# Patient Record
Sex: Male | Born: 1935
Health system: Southern US, Community
[De-identification: ages and names within clinical notes are randomized; demographics above are authoritative.]

## PROBLEM LIST (undated history)

## (undated) DIAGNOSIS — Z85810 Personal history of malignant neoplasm of tongue: Secondary | ICD-10-CM

## (undated) DIAGNOSIS — H269 Unspecified cataract: Secondary | ICD-10-CM

## (undated) DIAGNOSIS — R2681 Unsteadiness on feet: Secondary | ICD-10-CM

## (undated) DIAGNOSIS — S72001D Fracture of unspecified part of neck of right femur, subsequent encounter for closed fracture with routine healing: Secondary | ICD-10-CM

## (undated) DIAGNOSIS — N400 Enlarged prostate without lower urinary tract symptoms: Secondary | ICD-10-CM

## (undated) DIAGNOSIS — E46 Unspecified protein-calorie malnutrition: Secondary | ICD-10-CM

## (undated) DIAGNOSIS — N183 Chronic kidney disease, stage 3 unspecified: Secondary | ICD-10-CM

## (undated) DIAGNOSIS — G3183 Dementia with Lewy bodies: Secondary | ICD-10-CM

## (undated) DIAGNOSIS — G20 Parkinson's disease: Secondary | ICD-10-CM

## (undated) DIAGNOSIS — D62 Acute posthemorrhagic anemia: Secondary | ICD-10-CM

## (undated) DIAGNOSIS — G20A1 Parkinson's disease without dyskinesia, without mention of fluctuations: Secondary | ICD-10-CM

## (undated) DIAGNOSIS — F0280 Dementia in other diseases classified elsewhere without behavioral disturbance: Secondary | ICD-10-CM

## (undated) HISTORY — PX: EYE SURGERY: SHX253

## (undated) HISTORY — PX: TRANSURETHRAL RESECTION OF PROSTATE: SHX73

## (undated) HISTORY — DX: Dementia in other diseases classified elsewhere, unspecified severity, without behavioral disturbance, psychotic disturbance, mood disturbance, and anxiety: F02.80

## (undated) HISTORY — DX: Unsteadiness on feet: R26.81

## (undated) HISTORY — DX: Benign prostatic hyperplasia without lower urinary tract symptoms: N40.0

## (undated) HISTORY — DX: Unspecified protein-calorie malnutrition: E46

## (undated) HISTORY — DX: Dementia with Lewy bodies: G31.83

## (undated) HISTORY — PX: TONSILLECTOMY AND ADENOIDECTOMY: SHX28

## (undated) HISTORY — DX: Unspecified cataract: H26.9

## (undated) HISTORY — DX: Personal history of malignant neoplasm of tongue: Z85.810

## (undated) HISTORY — DX: Fracture of unspecified part of neck of right femur, subsequent encounter for closed fracture with routine healing: S72.001D

## (undated) HISTORY — PX: APPENDECTOMY: SHX54

## (undated) HISTORY — DX: Acute posthemorrhagic anemia: D62

---
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
## 2001-04-19 ENCOUNTER — Inpatient Hospital Stay (HOSPITAL_COMMUNITY): Admission: RE | Admit: 2001-04-19 | Discharge: 2001-04-21 | Payer: Self-pay | Admitting: Urology

## 2001-04-19 ENCOUNTER — Encounter (INDEPENDENT_AMBULATORY_CARE_PROVIDER_SITE_OTHER): Payer: Self-pay

## 2002-02-21 ENCOUNTER — Encounter (INDEPENDENT_AMBULATORY_CARE_PROVIDER_SITE_OTHER): Payer: Self-pay | Admitting: *Deleted

## 2002-10-26 ENCOUNTER — Encounter (INDEPENDENT_AMBULATORY_CARE_PROVIDER_SITE_OTHER): Payer: Self-pay | Admitting: *Deleted

## 2002-10-26 ENCOUNTER — Encounter (INDEPENDENT_AMBULATORY_CARE_PROVIDER_SITE_OTHER): Payer: Self-pay | Admitting: Gastroenterology

## 2003-05-03 ENCOUNTER — Encounter (INDEPENDENT_AMBULATORY_CARE_PROVIDER_SITE_OTHER): Payer: Self-pay | Admitting: *Deleted

## 2004-04-15 ENCOUNTER — Encounter (INDEPENDENT_AMBULATORY_CARE_PROVIDER_SITE_OTHER): Payer: Self-pay | Admitting: *Deleted

## 2005-03-30 ENCOUNTER — Ambulatory Visit: Payer: Self-pay | Admitting: Internal Medicine

## 2005-04-14 ENCOUNTER — Ambulatory Visit: Payer: Self-pay | Admitting: Internal Medicine

## 2005-04-23 ENCOUNTER — Ambulatory Visit: Payer: Self-pay | Admitting: Internal Medicine

## 2005-04-23 ENCOUNTER — Ambulatory Visit (HOSPITAL_COMMUNITY): Admission: RE | Admit: 2005-04-23 | Discharge: 2005-04-23 | Payer: Self-pay | Admitting: Internal Medicine

## 2005-04-23 ENCOUNTER — Encounter (INDEPENDENT_AMBULATORY_CARE_PROVIDER_SITE_OTHER): Payer: Self-pay | Admitting: Specialist

## 2006-03-02 ENCOUNTER — Encounter: Admission: RE | Admit: 2006-03-02 | Discharge: 2006-03-02 | Payer: Self-pay | Admitting: Internal Medicine

## 2008-07-05 ENCOUNTER — Encounter: Payer: Self-pay | Admitting: Internal Medicine

## 2008-10-25 ENCOUNTER — Ambulatory Visit: Payer: Self-pay | Admitting: Internal Medicine

## 2008-10-25 DIAGNOSIS — Z860100 Personal history of colon polyps, unspecified: Secondary | ICD-10-CM | POA: Insufficient documentation

## 2008-10-25 DIAGNOSIS — R195 Other fecal abnormalities: Secondary | ICD-10-CM | POA: Insufficient documentation

## 2008-10-26 ENCOUNTER — Encounter: Payer: Self-pay | Admitting: Internal Medicine

## 2008-10-31 ENCOUNTER — Ambulatory Visit: Payer: Self-pay | Admitting: Internal Medicine

## 2008-10-31 ENCOUNTER — Ambulatory Visit (HOSPITAL_COMMUNITY): Admission: RE | Admit: 2008-10-31 | Discharge: 2008-10-31 | Payer: Self-pay | Admitting: Internal Medicine

## 2008-10-31 ENCOUNTER — Encounter: Payer: Self-pay | Admitting: Internal Medicine

## 2008-11-02 ENCOUNTER — Encounter: Payer: Self-pay | Admitting: Internal Medicine

## 2009-10-09 ENCOUNTER — Telehealth (INDEPENDENT_AMBULATORY_CARE_PROVIDER_SITE_OTHER): Payer: Self-pay | Admitting: *Deleted

## 2009-11-18 ENCOUNTER — Encounter (INDEPENDENT_AMBULATORY_CARE_PROVIDER_SITE_OTHER): Payer: Self-pay | Admitting: *Deleted

## 2009-11-28 ENCOUNTER — Telehealth: Payer: Self-pay | Admitting: Internal Medicine

## 2009-12-02 ENCOUNTER — Encounter: Payer: Self-pay | Admitting: Internal Medicine

## 2010-01-02 ENCOUNTER — Ambulatory Visit: Payer: Self-pay | Admitting: Internal Medicine

## 2010-01-02 DIAGNOSIS — D126 Benign neoplasm of colon, unspecified: Secondary | ICD-10-CM | POA: Insufficient documentation

## 2010-01-16 ENCOUNTER — Ambulatory Visit: Payer: Self-pay | Admitting: Internal Medicine

## 2010-01-20 ENCOUNTER — Encounter: Payer: Self-pay | Admitting: Internal Medicine

## 2011-01-29 NOTE — Procedures (Signed)
Summary: Colonoscopy  Patient: Nicholas Andrews Note: All result statuses are Final unless otherwise noted.  Tests: (1) Colonoscopy (COL)   COL Colonoscopy           DONE     [facility]     [address]     [place], [place]  [address]           COLONOSCOPY PROCEDURE REPORT           PATIENT:  Nicholas Andrews, Nicholas Andrews  MR#:  [id number]     BIRTHDATE:  03/11/1918, 89 yrs. old  GENDER:  male           ENDOSCOPIST:  Smitty Ackerley. Eda Keys, MD     Referred by:  Jarome Matin, M.D.           PROCEDURE DATE:  01/16/2010     PROCEDURE:  Colonoscopy with snare polypectomy     ASA CLASS:  Class II     INDICATIONS:  history of multiple pre-cancerous (adenomatous)     colon polyps (sessile Villous cecal lesion piecemealed one year     ago)           MEDICATIONS:   Fentanyl 25 mcg IV, Versed 4 mg IV           DESCRIPTION OF PROCEDURE:   After the risks benefits and     alternatives of the procedure were thoroughly explained, informed     consent was obtained.  Digital rectal exam was performed and     revealed no abnormalities.   The LB CF-[id number] endoscope     was introduced through the anus and advanced to the cecum, which     was identified by both the appendix and ileocecal valve, without     limitations.Time to cecum = 4:57min. The quality of the prep was     good, using MoviPrep.  The instrument was then slowly withdrawn     (time = 16:06 min) as the colon was fully examined.     <<PROCEDUREIMAGES>>           FINDINGS:  A 5mm sessile polyp was found in the cecum. The prior     polypectomy site was clean. Polyp was snared without cautery.     Retrieval was successful.  A 6mm pedunculated polyp was found in     the descending colon. Polyp was snared without cautery. Retrieval     was successful. Some persistant post polypectomy oozing was     controlled w/ cautery.  This was otherwise a normal examination of     the colon. The previously tatoo'd area in the cecum was clean.  Retroflexed views in the rectum revealed no abnormalities.    The     scope was then withdrawn from the patient and the procedure     completed.           COMPLICATIONS:  None           ENDOSCOPIC IMPRESSION:     1) Sessile polyp in the cecum - removed     2) Pedunculated polyp in the descending colon -removed     3) Otherwise normal examination     RECOMMENDATIONS:     1) Return to the care of your primary provider. GI follow up as     needed           ______________________________     Wilhemina Bonito. Eda Keys, MD  CC:  Jarome Matin, MD;The Patient           n.     eSIGNED:   Wilhemina Bonito. Eda Keys at 01/16/2010 04:22 PM           Vida Rigger, [id number]  Note: An exclamation mark (!) indicates a result that was not dispersed into the flowsheet. Document Creation Date: 01/16/2010 4:22 PM _______________________________________________________________________  (1) Order result status: Final Collection or observation date-time: 01/16/2010 16:11 Requested date-time:  Receipt date-time:  Reported date-time:  Referring Physician:   Ordering Physician: Fransico Setters [id number]) Specimen Source:  Source: Launa Grill Order Number: [id number] Lab site:

## 2011-01-29 NOTE — Letter (Signed)
Summary: Patient Notice- Polyp Results  [facility]  [address] [place], [place] [address]   Phone: [phone]  Fax: [phone]        January 20, 2010 MRN: [id number]    Nicholas Andrews [address] [place], [place]  [address]    Dear Mr. COMPEAN,  I am pleased to inform you that the colon polyps removed during your recent colonoscopy were found to be benign (no cancer detected) upon pathologic examination.    Additional information/recommendations:  __ No further action with gastroenterology is needed at this time. Please      follow-up with your primary care physician for your other healthcare      needs.    Please call us if you are having persistent problems or have questions about your condition that have not been fully answered at this time.  Sincerely,  Hilarie Fredrickson MD  This letter has been electronically signed by your physician.  Appended Document: Patient Notice- Polyp Results letter mailed 1.25.11

## 2011-01-29 NOTE — Procedures (Signed)
Summary: colonoscopy   Colonoscopy  Procedure date:  02/21/2002  Findings:      Pathology:  Adenomatous polyp.        Location:  [facility].    Procedures Next Due Date:    Colonoscopy: 02/2003 Patient Name: Nicholas Andrews, Nicholas Andrews MRN:  Procedure Procedures: Colonoscopy CPT: 45409.    with biopsy. CPT: Q5068410.    with Hot Biopsy(s)CPT: Z451292.    with polypectomy. CPT: A3573898.  Personnel: Endoscopist: Ulyess Mort, MD.  Referred By: Barry Dienes. Eloise Harman, MD.  Exam Location: Exam performed in Outpatient Clinic. Outpatient  Patient Consent: Procedure, Alternatives, Risks and Benefits discussed, consent obtained, from patient. Consent was obtained by the RN.  Indications  Average Risk Screening Routine.  History  Pre-Exam Physical: Performed Feb 21, 2002. Cardio-pulmonary exam, Rectal exam, HEENT exam , Abdominal exam, Extremity exam, Mental status exam WNL.  Exam Exam: Extent of exam reached: Cecum, extent intended: Cecum.  The cecum was identified by appendiceal orifice and IC valve. Colon retroflexion performed. Images were not taken. ASA Classification: II. Tolerance: good.  Monitoring: Pulse and BP monitoring, Oximetry used. Supplemental O2 given.  Colon Prep Prep results: fair.  Sedation Meds: Patient assessed and found to be appropriate for moderate (conscious) sedation. Fentanyl 50 mcg. given IV. Versed 5 mg. given IV.  Findings POLYP: Descending Colon, Maximum size: 6 mm. sessile polyp. Procedure:  snare with cautery, removed, retrieved, Polyp sent to pathology. ICD9: Colon Polyps: 211.3.  - POLYP: Sigmoid Colon, Maximum size: 12 mm. pedunculated polyp. Procedure:  snare with cautery, removed, retrieved, sent to pathology. ICD9: Colon Polyps: 211.3.  - POLYP: Sigmoid Colon, Maximum size: 9 mm. pedunculated polyp. Procedure:  snare with cautery, removed, retrieved, sent to pathology.  - POLYP: Sigmoid Colon, Maximum size: 25 mm. sessile polyp.  Procedure:  snare with cautery, The polyp was removed piece meal. removed, retrieved, sent to pathology. ICD9: Colon Polyps: 211.3. Comments: PROBABLE VILLOUS ADENOMA CAUTERIZED TO DESTRUCTION WITH HOT BX. Marland Kitchen   Assessment Abnormal examination, see findings above.  Diagnoses: 211.3: Colon Polyps.   Events  Unplanned Interventions: No intervention was required.  Unplanned Events: There were no complications. Plans Medication Plan: Await pathology. Continue current medications.  Patient Education: Patient given standard instructions for: Polyps. Yearly hemoccult testing recommended. Patient instructed to get routine colonoscopy every 1 years. RECHECK 6 MONTHS.  Disposition: After procedure patient sent to recovery. After recovery patient sent home.   This report was created from the original endoscopy report, which was reviewed and signed by the above listed endoscopist.

## 2011-01-29 NOTE — Assessment & Plan Note (Signed)
Summary: Discuss colonoscopy   History of Present Illness Visit Type: follow up  Primary GI MD: Yancey Flemings MD Primary Provider: Jarome Matin, MD Requesting Provider: n/a Chief Complaint: Consult colon. Pt denies any GI complaints  History of Present Illness:   Nicholas Andrews is an 77 year old gentleman with dyslipidemia, BPH, and adenomatous colon polyps. Patient underwent colonoscopy last year to reevaluate a large sessile lesion of the sigmoid colon. This area was clear of polypoid tissue. However, evaluation of the cecum revealed a large sessile tubulovillous adenoma. This was removed in a piecemeal fashion endoscopically. Follow up in one year recommended if the patient was medically fit and willing. He remains in remarkable health since his last visit. His chronic medical problems are all stable. We reviewed his previous colonoscopy in detail as well as the rationale behind surveillance colonoscopy at this time. He denies active GI symptoms, except for occasional constipation for which he takes MiraLax.   GI Review of Systems      Denies abdominal pain, acid reflux, belching, bloating, chest pain, dysphagia with liquids, dysphagia with solids, heartburn, loss of appetite, nausea, vomiting, vomiting blood, weight loss, and  weight gain.      Reports constipation.     Denies anal fissure, black tarry stools, change in bowel habit, diarrhea, diverticulosis, fecal incontinence, heme positive stool, hemorrhoids, irritable bowel syndrome, jaundice, light color stool, liver problems, rectal bleeding, and  rectal pain.    Current Medications (verified): 1)  Lovastatin 10 Mg Tabs (Lovastatin) .Marland Kitchen.. 1 Tablet By Mouth Once Daily 2)  Aspirin 81 Mg Tbec (Aspirin) .Marland Kitchen.. 1 Tablet By Mouth Once Daily 3)  Proscar 5 Mg Tabs (Finasteride) .Marland Kitchen.. 1 Tablet Once Daily 4)  Miralax  Powd (Polyethylene Glycol 3350) .... As Needed  Allergies (verified): No Known Drug Allergies  Past History:  Past Medical  History: Reviewed history from 10/23/2008 and no changes required. Adenomatous Colon Polyps Hyperlipidemia  Past Surgical History: Reviewed history from 10/25/2008 and no changes required. Unremarkable  Family History: Reviewed history from 10/25/2008 and no changes required. No FH of Colon Cancer:  Social History: Reviewed history from 10/23/2008 and no changes required. Occupation: Retired Patient has never smoked.  Alcohol Use - no Illicit Drug Use - no  Review of Systems       his non-GI review of systems was found to be entirely negative except for occasional urinary difficulties  Vital Signs:  Patient profile:   77 year old male Height:      74 inches Weight:      208 pounds BSA:     2.21 Pulse rate:   68 / minute Pulse rhythm:   regular BP sitting:   120 / 60  (left arm) Cuff size:   regular  Vitals Entered By: Ok Anis CMA (January 02, 2010 2:26 PM)  Physical Exam  General:  Well developed, well nourished, no acute distress. Head:  Normocephalic and atraumatic. Eyes:  PERRLA, no icterus. Nose:  No deformity, discharge,  or lesions. Mouth:  No deformity or lesions,  Neck:  Supple; no masses or thyromegaly. Chest Wall:  Symmetrical,  no deformities . Lungs:  Clear throughout to auscultation. Heart:  Regular rate and rhythm; no murmurs, rubs,  or bruits. Abdomen:  Soft, nontender and nondistended. No masses, hepatosplenomegaly or hernias noted. Normal bowel sounds. Rectal:  deferred Msk:  Symmetrical with no gross deformities. Normal posture. Pulses:  Normal pulses noted. Extremities:  No clubbing, cyanosis, edema or deformities noted. Neurologic:  Alert and  oriented x4;  grossly normal neurologically. Skin:  Intact without significant lesions or rashes. Psych:  Alert and cooperative. Normal mood and affect.   Impression & Recommendations:  Problem # 1:  TUBULOVILLOUS ADENOMA, COLON (ICD-211.3) large tubulovillous adenoma of the cecum removed  piecemeal. Followup at this time would be warranted due to the configuration of the polyp as well as the removal technique. The rationale is to exclude the presence of recurrent adenoma. I discussed with the patient in great detail the rationale behind reevaluation at this time. We discussed the nature of the procedure as well as the risks, benefits, and alternatives in detail. This with reticular consideration to his age.Marland Kitchen Despite his excellent health, he is at higher than baseline risk due to his age. Upon completion of the discussion, he was quite interested in proceeding. He understood everything is presented and his questions answered. Greater than 50% of the time during this encounter was spent counseling the patient.  Problem # 2:  PERSONAL HX COLONIC POLYPS (ICD-V12.72) prior history of large lesion of the sigmoid colon. Tattoo marking placed previously. No residual tumor last year. We will will reevaluate this area as well during the upcoming colonoscopy  Other Orders: Colonoscopy (Colon)  Patient Instructions: 1)  Colonoscopy [facility] 01/16/10 4:00 pm 2)  Movi prep instructions given to patient 3)  Movi prep Rx. sent to pharmacy 4)  Colonoscopy and Flexible Sigmoidoscopy brochure given.  5)  The medication list was reviewed and reconciled.  All changed / newly prescribed medications were explained.  A complete medication list was provided to the patient / caregiver. 6)  Copy: Dr. Jarome Matin Prescriptions: MOVIPREP 100 GM  SOLR (PEG-KCL-NACL-NASULF-NA ASC-C) As per prep instructions.  #1 x 0   Entered by:   Milford Cage NCMA   Authorized by:   Hilarie Fredrickson MD   Signed by:   Milford Cage NCMA on 01/02/2010   Method used:   Electronically to        [facility] [id number]* (retail)       [address] [place], [place]  [address]       Ph: [phone] or [phone]       Fax: [phone]   RxID:   [id number]

## 2011-01-29 NOTE — Letter (Signed)
Summary: [facility] Instructions  [facility]  [address] [place], [place] [address]   Phone: [phone]  Fax: [phone]       Nicholas Andrews    [birth year]    MRN: [id number]        Procedure Day /Date:THURSDAY, 01/16/10     Arrival Time:3:00 PM     Procedure Time:4:00 PM     Location of Procedure:                    X  [facility] (4th Floor)                        PREPARATION FOR COLONOSCOPY WITH MOVIPREP   Starting 5 days prior to your procedure 01/11/10 do not eat nuts, seeds, popcorn, corn, beans, peas,  salads, or any raw vegetables.  Do not take any fiber supplements (e.g. Metamucil, Citrucel, and Benefiber).  THE DAY BEFORE YOUR PROCEDURE         DATE: 01/15/10  DAY: WEDNESDAY  1.  Drink clear liquids the entire day-NO SOLID FOOD  2.  Do not drink anything colored red or purple.  Avoid juices with pulp.  No orange juice.  3.  Drink at least 64 oz. (8 glasses) of fluid/clear liquids during the day to prevent dehydration and help the prep work efficiently.  CLEAR LIQUIDS INCLUDE: Water Jello Ice Popsicles Tea (sugar ok, no milk/cream) Powdered fruit flavored drinks Coffee (sugar ok, no milk/cream) Gatorade Juice: apple, white grape, white cranberry  Lemonade Clear bullion, consomm, broth Carbonated beverages (any kind) Strained chicken noodle soup Hard Candy                             4.  In the morning, mix first dose of MoviPrep solution:    Empty 1 Pouch A and 1 Pouch B into the disposable container    Add lukewarm drinking water to the top line of the container. Mix to dissolve    Refrigerate (mixed solution should be used within 24 hrs)  5.  Begin drinking the prep at 5:00 p.m. The MoviPrep container is divided by 4 marks.   Every 15 minutes drink the solution down to the next mark (approximately 8 oz) until the full liter is complete.   6.  Follow completed prep with 16 oz of clear liquid of your choice (Nothing red or  purple).  Continue to drink clear liquids until bedtime.  7.  Before going to bed, mix second dose of MoviPrep solution:    Empty 1 Pouch A and 1 Pouch B into the disposable container    Add lukewarm drinking water to the top line of the container. Mix to dissolve    Refrigerate  THE DAY OF YOUR PROCEDURE      DATE: 01/16/10 ONG:EXBMWUXL  Beginning at 11:00 a.m. (5 hours before procedure):         1. Every 15 minutes, drink the solution down to the next mark (approx 8 oz) until the full liter is complete.  2. Follow completed prep with 16 oz. of clear liquid of your choice.    3. You may drink clear liquids until 2:00 PM(2 HOURS BEFORE PROCEDURE).   MEDICATION INSTRUCTIONS  Unless otherwise instructed, you should take regular prescription medications with a small sip of water   as early as possible the morning of your procedure.  OTHER INSTRUCTIONS  You will need a responsible adult at least 75 years of age to accompany you and drive you home.   This person must remain in the waiting room during your procedure.  Wear loose fitting clothing that is easily removed.  Leave jewelry and other valuables at home.  However, you may wish to bring a book to read or  an iPod/MP3 player to listen to music as you wait for your procedure to start.  Remove all body piercing jewelry and leave at home.  Total time from sign-in until discharge is approximately 2-3 hours.  You should go home directly after your procedure and rest.  You can resume normal activities the  day after your procedure.  The day of your procedure you should not:   Drive   Make legal decisions   Operate machinery   Drink alcohol   Return to work  You will receive specific instructions about eating, activities and medications before you leave.    The above instructions have been reviewed and explained to me by   _______________________    I fully understand and can verbalize these  instructions _____________________________ Date _________

## 2011-01-29 NOTE — Procedures (Signed)
Summary: Colonoscopy   Colonoscopy  Procedure date:  04/15/2004  Findings:      Location:  [facility].  Results: Polyp. sigmoid  Tubular Adenoma  Patient Name: Nicholas Andrews, Nicholas Andrews MRN:  Procedure Procedures: Colonoscopy CPT: [phone].  Personnel: Endoscopist: Ulyess Mort, MD.  Exam Location: Exam performed in Outpatient Clinic. Outpatient  Patient Consent: Procedure, Alternatives, Risks and Benefits discussed, consent obtained, from patient. Consent was obtained by the RN.  Indications  Surveillance of: Adenomatous Polyp(s).  History  Current Medications: Patient is not currently taking Coumadin.  Pre-Exam Physical: Performed May 03, 2003. Entire physical exam was normal. Cardio- pulmonary exam, Rectal exam, HEENT exam , Abdominal exam, Extremity exam, Mental status exam WNL.  Exam Exam: Extent of exam reached: Cecum, extent intended: Cecum.  The cecum was identified by appendiceal orifice and IC valve. Colon retroflexion performed. Images were not taken. ASA Classification: II. Tolerance: good.  Monitoring: Pulse and BP monitoring, Oximetry used. Supplemental O2 given.  Colon Prep Prep results: good.  Sedation Meds: Patient assessed and found to be appropriate for moderate (conscious) sedation. Fentanyl 50 mcg. given IV. Versed 5 mg. given IV.  Findings POLYP: Ascending Colon, sessile polyp. Procedure:  hot biopsy, removed, not retrieved,  - DIVERTICULOSIS: Descending Colon to Sigmoid Colon. ICD9: Diverticulosis: 562.10. Comments: MILD.  POLYP: Sigmoid Colon, Maximum size: 4 mm. sessile polyp. Procedure:  hot biopsy, removed, not retrieved,  - POLYP: Sigmoid Colon, Maximum size: 15 mm. sessile polyp. Procedure:  hot biopsy, removed, Polyp sent to pathology. ICD9: Colon Polyps: 211.3.   Assessment Abnormal examination, see findings above.  Diagnoses: 211.3: Colon Polyps.  562.10: Diverticulosis.   Events  Unplanned Interventions: No  intervention was required.  Unplanned Events: There were no complications. Plans Patient Education: Patient given standard instructions for: Polyps. Diverticulosis. Yearly hemoccult testing recommended. Patient instructed to get routine colonoscopy every 1 years. HAVE DR. PERRY SEE AND CONSIDER LASER.  Disposition: After procedure patient sent to recovery. After recovery patient sent home.   This report was created from the original endoscopy report, which was reviewed and signed by the above listed endoscopist.

## 2011-01-29 NOTE — Procedures (Signed)
Summary: Colonoscopy   Colonoscopy  Procedure date:  05/03/2003  Findings:      Location:  [facility].  Results: Polyp. Fragments of Adenoma (inflammation)   Colonoscopy  Procedure date:  05/03/2003  Findings:      Location:  [facility].  Results: Polyp. Fragments of Adenoma (inflammation)  Patient Name: Nicholas Andrews, Nicholas Andrews MRN:  Procedure Procedures: Colonoscopy CPT: [phone].    with Hot Biopsy(s)CPT: Z451292.    with polypectomy. CPT: A3573898.  Personnel: Endoscopist: Ulyess Mort, MD.  Exam Location: Exam performed in Outpatient Clinic. Outpatient  Patient Consent: Procedure, Alternatives, Risks and Benefits discussed, consent obtained, from patient. Consent was obtained by the RN.  Indications  Surveillance of: Adenomatous Polyp(s).  History  Pre-Exam Physical: Performed May 03, 2003. Cardio-pulmonary exam, Rectal exam, HEENT exam , Abdominal exam, Extremity exam, Mental status exam WNL.  Exam Exam: Extent of exam reached: Cecum, extent intended: Cecum.  The cecum was identified by appendiceal orifice and IC valve. Colon retroflexion performed. Images were not taken. ASA Classification: II. Tolerance: good.  Monitoring: Pulse and BP monitoring, Oximetry used. Supplemental O2 given.  Colon Prep Prep results: good.  Sedation Meds: Patient assessed and found to be appropriate for moderate (conscious) sedation. Fentanyl 50 mcg. given IV. Versed 3 mg. given IV.  Findings POLYP: Descending Colon, Maximum size: 2 mm. sessile polyp. Procedure:  hot biopsy, removed, not retrieved,  POLYP: Descending Colon, Maximum size: 2 mm. sessile polyp. Procedure:  hot biopsy, removed, not retrieved,  - POLYP: Sigmoid Colon, Maximum size: 22 mm. sessile polyp. Procedure:  snare with cautery, removed, retrieved, Polyp sent to pathology. ICD9: Colon Polyps: 211.3. Comments: 3cc saline injected into base.  POLYP: Sigmoid Colon, Maximum size: 2 mm. sessile  polyp. Procedure:  hot biopsy, removed, not retrieved,  POLYP: Sigmoid Colon, Maximum size: 2 mm. sessile polyp. Procedure:  hot biopsy, removed, not retrieved,   Assessment Abnormal examination, see findings above.  Diagnoses: 211.3: Colon Polyps.   Events  Unplanned Interventions: No intervention was required.  Unplanned Events: There were no complications. Plans Medication Plan: Await pathology. Continue current medications.  Patient Education: Patient given standard instructions for: Polyps. Yearly hemoccult testing recommended. Patient instructed to get routine colonoscopy every 1 years.  Disposition: After procedure patient sent to recovery. After recovery patient sent home.   This report was created from the original endoscopy report, which was reviewed and signed by the above listed endoscopist.

## 2011-05-15 NOTE — H&P (Signed)
[facility]  Patient:    Nicholas Andrews, Nicholas Andrews                     MRN: [id number] Adm. Date:  [id number] Attending:  Ellwood Handler                         History and Physical  DATE OF BIRTH:  [birth year].  REFERRING PHYSICIAN:  Tera Mater. Evlyn Kanner, M.D.  INDICATION:  [age]-year-old white male with obstructive voiding symptoms and presumed BPH.  These are notable points of the recent past medical history: #1 - October of 1992, PSA 1.0.  Symptoms treated with p.o. Proscar.  #2 - October of 1993, ultrasound showed a 12.0-cm right kidney, 11.9-cm left kidney with a 14-mm simple cyst.  CYSTO showed BPH and a bladder stone.  He was treated with a EHL and a VLAP.  #3 - February of 1998, PSA 2.4.  UTIs in 1997 and 2000.  #4 - April of 2002, returned to our office with increasing obstructive voiding symptoms.  IVP:  A 12.4-cm right kidney, 12.2-cm left kidney.  CYSTO showed PVR of 110 cc, with a densely trabeculated bladder and smooth regrowth from the lateral lobes, anatomy within the prostatic urethra somewhat distorted due to prior VLAP.  Discussed with patient the risks and benefits of the procedure and he agrees to proceed and understands the nature of condition and the procedure.  MEDICATIONS:  Aspirin -- discontinued April 09, 2001.  ALLERGIES:  Denies.  SOCIAL HISTORY:  Tobacco and ETOH:  Denies.  PAST MEDICAL HISTORY:  EHL of bladder stone, VLAP, 1993.  SOCIAL HISTORY:  Patients father died at 13; mother died at 22. [age]-year-old wife.  Four grown children.  PHYSICAL EXAMINATION AND REVIEW OF SYSTEMS:  GENERAL:  Tall, thin, healthy, alert 77 year old male looking younger than his stated age.  VITAL SIGNS:  Stable.  Afebrile.  HEAD AND NECK:  Negative adenopathy.  Negative bruit.  LUNGS:  Clear to P&A.  HEART:  Regular rate and rhythm without murmur or gallop.  ABDOMEN:  Positive bowel sounds.  Soft.  No mass or  organomegaly.  GU:  Bilaterally descended testes.  Normal prostate.  NEUROLOGIC:  Grossly intact.  PLAN:  Will proceed this a.m. with TURP. DD:  04/19/01 TD:  04/20/01 Job: [id number]

## 2011-05-15 NOTE — Discharge Summary (Signed)
[facility]  Patient:    Nicholas Andrews, Nicholas Andrews                     MRN: [id number] Adm. Date:  [id number] Disc. Date: [id number] Attending:  Ellwood Handler                           Discharge Summary  DATE OF BIRTH:  1918-01-01  PREOPERATIVE DIAGNOSIS:  Benign prostatic hypertrophy.  POSTOPERATIVE DIAGNOSIS:  Benign prostatic hypertrophy.  Indications, medications, allergies, tobacco, ETOH, past medical history, social history, physical examination, and review of systems were all outlined in the admitting note.  HOSPITAL COURSE:  Patient was admitted April 19, 2001, underwent TURP under spinal anesthesia.  Patient had a pleasantly uneventful recovery.  Bladder irrigation was discontinued on postoperative day #1.  Foley was discontinued on postoperative day #2, BUN 19, creatinine 1.4, hemoglobin 15.6, white count 6.8.  Patient voiding pink urine with mild irritative symptoms.  Preliminary pathology report on tissue submitted, stromal and glandular hyperplasia without evidence of malignancy.  PLAN:  Discharge home on April 21, 2001.  DISCHARGE MEDICATIONS:  Macrobid, Pyridium, and Darvocet.  FOLLOW-UP:  Follow up in two to three weeks with Dr. Wanda Plump.  No driving for 10 days.  No lifting for two to three weeks. DD:  04/21/01 TD:  04/21/01 Job: [id number]

## 2011-05-15 NOTE — Op Note (Signed)
[facility]  Patient:    Nicholas Andrews, Nicholas Andrews                     MRN: [id number] Proc. Date: 04/19/01 Adm. Date:  [id number] Attending:  Ellwood Handler CC:         Tera Mater. Evlyn Kanner, M.D.   Operative Report  DATE OF BIRTH:  November 24, 1918  PREOPERATIVE DIAGNOSIS: Benign prostatic hypertrophy with increasing voiding symptoms.  See H&P from April 19, 2001.  POSTOPERATIVE DIAGNOSIS:  Benign prostatic hypertrophy.  OPERATION:  Cystoscopy transurethral resection of prostate.  SURGEON:  Verl Dicker, M.D.  ANESTHESIA:  Spinal  DRAINS:  No. 24 French Foley.  SPECIMENS:  TUR chips.  DESCRIPTION OF PROCEDURE:  The patient was prepped and draped in the dorsal lithotomy position after institution of adequate level of spinal anesthesia. Urethra was dilated using sequential Sissy Hoff sounds; 27-French continuous flow resectoscope sheath was inserted.  Careful inspection showed distorted anatomy within the prostatic urethra due to prior VLAP.  Verumontanum was diminished and scarred.  There was smooth regrowth on each side with pale mucosa.  Bladder was densely trabeculated. The patient had history of bladder stone and VLAP in 1993.  Right and left ureteral orifices were finally identified between cellules within the bladder wall.  Resection was initiated with a single furrow at the 6 oclock position to allow free efflux of chips. Resection was carried to a point just proximal to the prostatic apex.  A small nodule of tissue was left on both right and left prostatic apex due to distorted anatomy in the region of the verumontanum.  Resection was then begun at the 10 oclock position on the right lobe and carried down to the 6 oclock position, begun again at the 2 oclock position on the left lobe and carried down to the 6 oclock position.  A small amount of tissue was resected anteriorly, and no capsule perforations were noted.  A small shelf of  tissue had been created at the bladder neck that was incised with the Collins knife at the 3 and 9 oclock position.  VaporTrode element was then inserted to ensure adequate hemostasis within the prostatic urethra.  A 24-French Foley was inserted and left to straight drain, and the patient was returned to recovery in satisfactory condition. DD:  04/19/01 TD:  04/20/01 Job: [id number]

## 2012-02-18 ENCOUNTER — Ambulatory Visit (INDEPENDENT_AMBULATORY_CARE_PROVIDER_SITE_OTHER): Payer: Medicare Other | Admitting: Family Medicine

## 2012-02-18 VITALS — BP 174/66 | HR 71 | Temp 97.5°F | Resp 16 | Ht 72.75 in | Wt 202.0 lb

## 2012-02-18 DIAGNOSIS — N390 Urinary tract infection, site not specified: Secondary | ICD-10-CM | POA: Diagnosis not present

## 2012-02-18 DIAGNOSIS — R350 Frequency of micturition: Secondary | ICD-10-CM

## 2012-02-18 DIAGNOSIS — H9190 Unspecified hearing loss, unspecified ear: Secondary | ICD-10-CM | POA: Insufficient documentation

## 2012-02-18 DIAGNOSIS — R351 Nocturia: Secondary | ICD-10-CM

## 2012-02-18 LAB — POCT UA - MICROSCOPIC ONLY
Casts, Ur, LPF, POC: NEGATIVE
Crystals, Ur, HPF, POC: NEGATIVE
Yeast, UA: NEGATIVE

## 2012-02-18 LAB — POCT URINALYSIS DIPSTICK: Leukocytes, UA: NEGATIVE

## 2012-02-18 MED ORDER — CEPHALEXIN 500 MG PO CAPS: 1000.0000 mg | ORAL_CAPSULE | Freq: Two times a day (BID) | ORAL | Status: AC

## 2012-02-18 NOTE — Progress Notes (Signed)
Patient Name: Nicholas Andrews Date of Birth: September 08, 1918 Medical Record Number: [id number] Gender: male Date of Encounter: 02/18/2012  History of Present Illness:  Nicholas Andrews is a 78 y.o. very pleasant male patient who presents with the following:  He is concerned about UTI- he has a history of recurrent UTI.  He has had this a few times and recognizes the symptoms.  Currently with symptoms for a couple of weeks.  Having to urinate frequently and more at night than he is used to.  No dysuria, no blood in urine.  No fever or vomiting.  He has had a prostatectomy in the past  Patient Active Problem List  Diagnoses  . TUBULOVILLOUS ADENOMA, COLON  . FECAL OCCULT BLOOD  . PERSONAL HX COLONIC POLYPS   Past Medical History  Diagnosis Date  . BPH (benign prostatic hyperplasia)   . H/O tongue cancer    Past Surgical History  Procedure Date  . Tonsillectomy and adenoidectomy   . Transurethral resection of prostate    History  Substance Use Topics  . Smoking status: Never Smoker   . Smokeless tobacco: Not on file  . Alcohol Use: Not on file   History reviewed. No pertinent family history. No Known Allergies  Medication list has been reviewed and updated.  Review of Systems: As per HPI- otherwise negative.  Physical Examination: Filed Vitals:   02/18/12 1338  BP: 174/66  Pulse: 71  Temp: 97.5 F (36.4 C)  TempSrc: Oral  Resp: 16  Height: 6' 0.75" (1.848 m)  Weight: 202 lb (91.627 kg)    Body mass index is 26.83 kg/(m^2).  GEN: WDWN, NAD, Non-toxic, A & O x 3.  Quite hard of hearing HEENT: Atraumatic, Normocephalic. Neck supple. No masses, No LAD. Ears and Nose: No external deformity. CV: RRR, No M/G/R. No JVD. No thrill. No extra heart sounds. PULM: CTA B, no wheezes, crackles, rhonchi. No retractions. No resp. distress. No accessory muscle use. ABD: S, NT, ND. No rebound. No HSM.  No CVA tenderness EXTR: No c/c/e NEURO Normal gait.  PSYCH: Normally interactive.  Conversant. Not depressed or anxious appearing.  Calm demeanor.   Results for orders placed in visit on 02/18/12  POCT UA - MICROSCOPIC ONLY      Component Value Range   WBC, Ur, HPF, POC 3-8     RBC, urine, microscopic 0-1     Bacteria, U Microscopic trace     Mucus, UA neg     Epithelial cells, urine per micros 1-3     Crystals, Ur, HPF, POC neg     Casts, Ur, LPF, POC neg     Yeast, UA neg    POCT URINALYSIS DIPSTICK      Component Value Range   Color, UA yellow     Clarity, UA clear     Glucose, UA neg     Bilirubin, UA neg     Ketones, UA neg     Spec Grav, UA 1.015     Blood, UA neg     pH, UA 6.0     Protein, UA neg     Urobilinogen, UA 0.2     Nitrite, UA neg     Leukocytes, UA Negative      Assessment and Plan: 1. Urinary frequency  POCT UA - Microscopic Only, POCT urinalysis dipstick, Urine culture, cephALEXin (KEFLEX) 500 MG capsule  2. Hard of hearing    3. UTI (lower urinary tract infection)    keflex  for likely UTI.  Asked pt to call me if he's not better within a couple of days. Sooner if worse. Drink plenty of fluids  Await Ucx

## 2012-02-20 LAB — URINE CULTURE
Colony Count: NO GROWTH
Organism ID, Bacteria: NO GROWTH

## 2012-02-21 ENCOUNTER — Encounter: Payer: Self-pay | Admitting: Family Medicine

## 2012-05-12 DIAGNOSIS — E785 Hyperlipidemia, unspecified: Secondary | ICD-10-CM | POA: Diagnosis not present

## 2012-05-12 DIAGNOSIS — I10 Essential (primary) hypertension: Secondary | ICD-10-CM | POA: Diagnosis not present

## 2012-05-12 DIAGNOSIS — Z125 Encounter for screening for malignant neoplasm of prostate: Secondary | ICD-10-CM | POA: Diagnosis not present

## 2012-05-19 DIAGNOSIS — N62 Hypertrophy of breast: Secondary | ICD-10-CM | POA: Diagnosis not present

## 2012-05-19 DIAGNOSIS — Z0000 Encounter for general adult medical examination without abnormal findings: Secondary | ICD-10-CM | POA: Diagnosis not present

## 2012-05-19 DIAGNOSIS — I10 Essential (primary) hypertension: Secondary | ICD-10-CM | POA: Diagnosis not present

## 2012-05-30 DIAGNOSIS — Z1212 Encounter for screening for malignant neoplasm of rectum: Secondary | ICD-10-CM | POA: Diagnosis not present

## 2012-08-23 DIAGNOSIS — L219 Seborrheic dermatitis, unspecified: Secondary | ICD-10-CM | POA: Diagnosis not present

## 2012-09-10 ENCOUNTER — Other Ambulatory Visit: Payer: Self-pay

## 2012-09-10 MED ORDER — OMEPRAZOLE 20 MG PO CPDR: 20.0000 mg | DELAYED_RELEASE_CAPSULE | Freq: Every day | ORAL | Status: DC

## 2012-09-22 ENCOUNTER — Ambulatory Visit (INDEPENDENT_AMBULATORY_CARE_PROVIDER_SITE_OTHER): Payer: Medicare Other | Admitting: Radiology

## 2012-09-22 DIAGNOSIS — Z23 Encounter for immunization: Secondary | ICD-10-CM

## 2012-11-09 ENCOUNTER — Ambulatory Visit (INDEPENDENT_AMBULATORY_CARE_PROVIDER_SITE_OTHER): Payer: Medicare Other | Admitting: Emergency Medicine

## 2012-11-09 VITALS — BP 162/92 | HR 79 | Temp 98.6°F | Resp 17 | Ht 71.5 in | Wt 200.0 lb

## 2012-11-09 DIAGNOSIS — R350 Frequency of micturition: Secondary | ICD-10-CM | POA: Diagnosis not present

## 2012-11-09 LAB — POCT UA - MICROSCOPIC ONLY
Crystals, Ur, HPF, POC: NEGATIVE
Yeast, UA: NEGATIVE

## 2012-11-09 LAB — POCT URINALYSIS DIPSTICK
Ketones, UA: NEGATIVE
Nitrite, UA: POSITIVE
Protein, UA: NEGATIVE
Spec Grav, UA: 1.015
Urobilinogen, UA: 0.2

## 2012-11-09 MED ORDER — CIPROFLOXACIN HCL 250 MG PO TABS: 250.0000 mg | ORAL_TABLET | Freq: Two times a day (BID) | ORAL | Status: DC

## 2012-11-09 NOTE — Progress Notes (Signed)
  Subjective:    Patient ID: Nicholas Andrews, male    DOB: [birth year], 78 y.o.   MRN: [id number]  HPI  78 yo presents with urinary frequency.  Any time he hears water running, he has to urinate.  Does not strain to get urine and he got up three times last night to urinate patient is already on Proscar for urinary symptoms. He is concerned he may have a bladder infection. He states he does not feel bad he does not have fevers chills or other symptoms..  .      Review of Systems     Objective:   Physical Exam chest is clear cardiac exam reveals a 2/6 diastolic murmur at lower left sternal border. Abdomen is flat liver and spleen not enlarged bladder is not distended rectal exam reveals a fairly small but somewhat hard prostate. Results for orders placed in visit on 11/09/12  POCT UA - MICROSCOPIC ONLY      Component Value Range   WBC, Ur, HPF, POC 6-10     RBC, urine, microscopic 0-1     Bacteria, U Microscopic 2+     Mucus, UA neg     Epithelial cells, urine per micros 1-3     Crystals, Ur, HPF, POC neg     Casts, Ur, LPF, POC neg     Yeast, UA neg    POCT URINALYSIS DIPSTICK      Component Value Range   Color, UA yellow     Clarity, UA cloudy     Glucose, UA neg     Bilirubin, UA neg     Ketones, UA neg     Spec Grav, UA 1.015     Blood, UA neg     pH, UA 7.0     Protein, UA neg     Urobilinogen, UA 0.2     Nitrite, UA positive     Leukocytes, UA small (1+)           Assessment & Plan:  He does have white cells with positive nitrite. We'll go ahead and treat with Cipro 250 twice a day for 7 days and check  a urine culture.

## 2012-11-09 NOTE — Patient Instructions (Addendum)
Urinary Tract Infection Urinary tract infections (UTIs) can develop anywhere along your urinary tract. Your urinary tract is your body's drainage system for removing wastes and extra water. Your urinary tract includes two kidneys, two ureters, a bladder, and a urethra. Your kidneys are a pair of bean-shaped organs. Each kidney is about the size of your fist. They are located below your ribs, one on each side of your spine. CAUSES Infections are caused by microbes, which are microscopic organisms, including fungi, viruses, and bacteria. These organisms are so small that they can only be seen through a microscope. Bacteria are the microbes that most commonly cause UTIs. SYMPTOMS  Symptoms of UTIs may vary by age and gender of the patient and by the location of the infection. Symptoms in young women typically include a frequent and intense urge to urinate and a painful, burning feeling in the bladder or urethra during urination. Older women and men are more likely to be tired, shaky, and weak and have muscle aches and abdominal pain. A fever may mean the infection is in your kidneys. Other symptoms of a kidney infection include pain in your back or sides below the ribs, nausea, and vomiting. DIAGNOSIS To diagnose a UTI, your caregiver will ask you about your symptoms. Your caregiver also will ask to provide a urine sample. The urine sample will be tested for bacteria and white blood cells. White blood cells are made by your body to help fight infection. TREATMENT  Typically, UTIs can be treated with medication. Because most UTIs are caused by a bacterial infection, they usually can be treated with the use of antibiotics. The choice of antibiotic and length of treatment depend on your symptoms and the type of bacteria causing your infection. HOME CARE INSTRUCTIONS  If you were prescribed antibiotics, take them exactly as your caregiver instructs you. Finish the medication even if you feel better after you  have only taken some of the medication.  Drink enough water and fluids to keep your urine clear or pale yellow.  Avoid caffeine, tea, and carbonated beverages. They tend to irritate your bladder.  Empty your bladder often. Avoid holding urine for long periods of time.  Empty your bladder before and after sexual intercourse.  After a bowel movement, women should cleanse from front to back. Use each tissue only once. SEEK MEDICAL CARE IF:   You have back pain.  You develop a fever.  Your symptoms do not begin to resolve within 3 days. SEEK IMMEDIATE MEDICAL CARE IF:   You have severe back pain or lower abdominal pain.  You develop chills.  You have nausea or vomiting.  You have continued burning or discomfort with urination. MAKE SURE YOU:   Understand these instructions.  Will watch your condition.  Will get help right away if you are not doing well or get worse. Document Released: 09/23/2005 Document Revised: 06/14/2012 Document Reviewed: 01/22/2012 [employer] Patient Information 2013 [employer].  

## 2012-11-12 LAB — URINE CULTURE: Colony Count: >=100000

## 2012-11-16 ENCOUNTER — Ambulatory Visit (INDEPENDENT_AMBULATORY_CARE_PROVIDER_SITE_OTHER): Payer: Medicare Other | Admitting: Family Medicine

## 2012-11-16 VITALS — BP 155/72 | HR 86 | Temp 97.5°F | Resp 18 | Ht 72.0 in | Wt 204.0 lb

## 2012-11-16 DIAGNOSIS — N390 Urinary tract infection, site not specified: Secondary | ICD-10-CM

## 2012-11-16 LAB — POCT UA - MICROSCOPIC ONLY
Casts, Ur, LPF, POC: NEGATIVE
Crystals, Ur, HPF, POC: NEGATIVE
Mucus, UA: NEGATIVE
Yeast, UA: NEGATIVE

## 2012-11-16 LAB — POCT URINALYSIS DIPSTICK
Glucose, UA: NEGATIVE
Leukocytes, UA: NEGATIVE
Nitrite, UA: NEGATIVE
Spec Grav, UA: 1.015

## 2012-11-16 NOTE — Patient Instructions (Addendum)
Continue to drink plenty of fluids to keep your bladder well flushed out.

## 2012-11-16 NOTE — Progress Notes (Signed)
Subjective: Patient was here a week ago with a low-grade urinary infection. The culture grew Citrobacter.  He has been feeling better. He does have some urinary frequency still.  Objective: No CVA tenderness Abdomen nontender   Assessment: UTI followup, symptomatically improved  Plan: Recheck the urinalysis  Results for orders placed in visit on 11/16/12  POCT UA - MICROSCOPIC ONLY      Component Value Range   WBC, Ur, HPF, POC 0-1     RBC, urine, microscopic 0-1     Bacteria, U Microscopic neg     Mucus, UA neg     Epithelial cells, urine per micros 0-1     Crystals, Ur, HPF, POC neg     Casts, Ur, LPF, POC neg     Yeast, UA neg    POCT URINALYSIS DIPSTICK      Component Value Range   Color, UA yellow     Clarity, UA clear     Glucose, UA neg     Bilirubin, UA neg     Ketones, UA neg     Spec Grav, UA 1.015     Blood, UA trace-intact     pH, UA 6.5     Protein, UA neg     Urobilinogen, UA 0.2     Nitrite, UA neg     Leukocytes, UA Negative     No new treatment needed.

## 2013-01-24 DIAGNOSIS — L821 Other seborrheic keratosis: Secondary | ICD-10-CM | POA: Diagnosis not present

## 2013-01-24 DIAGNOSIS — L570 Actinic keratosis: Secondary | ICD-10-CM | POA: Diagnosis not present

## 2013-05-04 DIAGNOSIS — H26499 Other secondary cataract, unspecified eye: Secondary | ICD-10-CM | POA: Diagnosis not present

## 2013-05-04 DIAGNOSIS — H5502 Latent nystagmus: Secondary | ICD-10-CM | POA: Diagnosis not present

## 2013-05-18 DIAGNOSIS — I10 Essential (primary) hypertension: Secondary | ICD-10-CM | POA: Diagnosis not present

## 2013-05-18 DIAGNOSIS — Z125 Encounter for screening for malignant neoplasm of prostate: Secondary | ICD-10-CM | POA: Diagnosis not present

## 2013-05-18 DIAGNOSIS — E785 Hyperlipidemia, unspecified: Secondary | ICD-10-CM | POA: Diagnosis not present

## 2013-05-25 DIAGNOSIS — E785 Hyperlipidemia, unspecified: Secondary | ICD-10-CM | POA: Diagnosis not present

## 2013-05-25 DIAGNOSIS — I10 Essential (primary) hypertension: Secondary | ICD-10-CM | POA: Diagnosis not present

## 2013-05-25 DIAGNOSIS — Z0000 Encounter for general adult medical examination without abnormal findings: Secondary | ICD-10-CM | POA: Diagnosis not present

## 2013-05-25 DIAGNOSIS — Z1331 Encounter for screening for depression: Secondary | ICD-10-CM | POA: Diagnosis not present

## 2013-05-25 DIAGNOSIS — R351 Nocturia: Secondary | ICD-10-CM | POA: Diagnosis not present

## 2013-05-25 DIAGNOSIS — Z6827 Body mass index (BMI) 27.0-27.9, adult: Secondary | ICD-10-CM | POA: Diagnosis not present

## 2013-05-25 DIAGNOSIS — Z79899 Other long term (current) drug therapy: Secondary | ICD-10-CM | POA: Diagnosis not present

## 2013-06-09 DIAGNOSIS — Z1212 Encounter for screening for malignant neoplasm of rectum: Secondary | ICD-10-CM | POA: Diagnosis not present

## 2013-09-12 DIAGNOSIS — K048 Radicular cyst: Secondary | ICD-10-CM | POA: Diagnosis not present

## 2013-09-12 DIAGNOSIS — K1370 Unspecified lesions of oral mucosa: Secondary | ICD-10-CM | POA: Diagnosis not present

## 2013-10-19 ENCOUNTER — Ambulatory Visit (INDEPENDENT_AMBULATORY_CARE_PROVIDER_SITE_OTHER): Payer: Medicare Other | Admitting: Family Medicine

## 2013-10-19 DIAGNOSIS — Z23 Encounter for immunization: Secondary | ICD-10-CM

## 2013-11-27 DIAGNOSIS — H26499 Other secondary cataract, unspecified eye: Secondary | ICD-10-CM | POA: Diagnosis not present

## 2014-05-29 DIAGNOSIS — H3530 Unspecified macular degeneration: Secondary | ICD-10-CM | POA: Diagnosis not present

## 2014-05-31 DIAGNOSIS — E785 Hyperlipidemia, unspecified: Secondary | ICD-10-CM | POA: Diagnosis not present

## 2014-05-31 DIAGNOSIS — Z125 Encounter for screening for malignant neoplasm of prostate: Secondary | ICD-10-CM | POA: Diagnosis not present

## 2014-05-31 DIAGNOSIS — I10 Essential (primary) hypertension: Secondary | ICD-10-CM | POA: Diagnosis not present

## 2014-06-07 DIAGNOSIS — I10 Essential (primary) hypertension: Secondary | ICD-10-CM | POA: Diagnosis not present

## 2014-06-07 DIAGNOSIS — Z0000 Encounter for general adult medical examination without abnormal findings: Secondary | ICD-10-CM | POA: Diagnosis not present

## 2014-06-07 DIAGNOSIS — Z1331 Encounter for screening for depression: Secondary | ICD-10-CM | POA: Diagnosis not present

## 2014-06-07 DIAGNOSIS — Z79899 Other long term (current) drug therapy: Secondary | ICD-10-CM | POA: Diagnosis not present

## 2014-06-07 DIAGNOSIS — E785 Hyperlipidemia, unspecified: Secondary | ICD-10-CM | POA: Diagnosis not present

## 2014-06-07 DIAGNOSIS — N183 Chronic kidney disease, stage 3 unspecified: Secondary | ICD-10-CM | POA: Diagnosis not present

## 2014-06-07 DIAGNOSIS — R634 Abnormal weight loss: Secondary | ICD-10-CM | POA: Diagnosis not present

## 2014-09-27 ENCOUNTER — Ambulatory Visit (INDEPENDENT_AMBULATORY_CARE_PROVIDER_SITE_OTHER): Payer: Medicare Other | Admitting: *Deleted

## 2014-09-27 DIAGNOSIS — Z23 Encounter for immunization: Secondary | ICD-10-CM

## 2015-02-13 DIAGNOSIS — D2311 Other benign neoplasm of skin of right eyelid, including canthus: Secondary | ICD-10-CM | POA: Diagnosis not present

## 2015-02-13 DIAGNOSIS — H10523 Angular blepharoconjunctivitis, bilateral: Secondary | ICD-10-CM | POA: Diagnosis not present

## 2015-03-05 ENCOUNTER — Ambulatory Visit (INDEPENDENT_AMBULATORY_CARE_PROVIDER_SITE_OTHER): Payer: Medicare Other | Admitting: Family Medicine

## 2015-03-05 VITALS — BP 138/60 | HR 62 | Temp 97.3°F | Resp 17 | Ht 71.0 in | Wt 189.0 lb

## 2015-03-05 DIAGNOSIS — R350 Frequency of micturition: Secondary | ICD-10-CM | POA: Diagnosis not present

## 2015-03-05 DIAGNOSIS — R8299 Other abnormal findings in urine: Secondary | ICD-10-CM | POA: Diagnosis not present

## 2015-03-05 DIAGNOSIS — R8281 Pyuria: Secondary | ICD-10-CM

## 2015-03-05 DIAGNOSIS — N390 Urinary tract infection, site not specified: Secondary | ICD-10-CM

## 2015-03-05 LAB — POCT UA - MICROSCOPIC ONLY
BACTERIA, U MICROSCOPIC: NEGATIVE
Casts, Ur, LPF, POC: NEGATIVE
Crystals, Ur, HPF, POC: NEGATIVE
Mucus, UA: NEGATIVE
Yeast, UA: NEGATIVE

## 2015-03-05 LAB — POCT URINALYSIS DIPSTICK
Bilirubin, UA: NEGATIVE
Glucose, UA: NEGATIVE
KETONES UA: NEGATIVE
Nitrite, UA: NEGATIVE
PROTEIN UA: NEGATIVE
Spec Grav, UA: 1.015
Urobilinogen, UA: 0.2
pH, UA: 6.5

## 2015-03-05 MED ORDER — CIPROFLOXACIN HCL 250 MG PO TABS: 250.0000 mg | ORAL_TABLET | Freq: Two times a day (BID) | ORAL | Status: DC

## 2015-03-05 NOTE — Patient Instructions (Signed)

## 2015-03-05 NOTE — Addendum Note (Signed)
Addended by: Ivor Reining on: 03/05/2015 05:08 PM   Modules accepted: Orders

## 2015-03-05 NOTE — Progress Notes (Signed)
81 yo retired gentleman with urinary frequency and h/o UTI.  No dysuria, back pain, or fever. Last UTI 2 years ago S/P TURP years ago No problem with flow.  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  Objective:  NAD  Results for orders placed or performed in visit on 03/05/15  POCT UA - Microscopic Only  Result Value Ref Range   WBC, Ur, HPF, POC 3-10    RBC, urine, microscopic 1-3    Bacteria, U Microscopic neg    Mucus, UA neg    Epithelial cells, urine per micros 1-2    Crystals, Ur, HPF, POC neg    Casts, Ur, LPF, POC neg    Yeast, UA neg   POCT urinalysis dipstick  Result Value Ref Range   Color, UA yellow    Clarity, UA hazy    Glucose, UA neg    Bilirubin, UA neg    Ketones, UA neg    Spec Grav, UA 1.015    Blood, UA trace-intact    pH, UA 6.5    Protein, UA neg    Urobilinogen, UA 0.2    Nitrite, UA neg    Leukocytes, UA moderate (2+)    This chart was scribed in my presence and reviewed by me personally.    ICD-9-CM ICD-10-CM   1. Urinary frequency 788.41 R35.0 POCT UA - Microscopic Only     POCT urinalysis dipstick     Urine culture     ciprofloxacin (CIPRO) 250 MG tablet  2. Pyuria 791.9 N39.0 Urine culture     ciprofloxacin (CIPRO) 250 MG tablet     Signed, Robyn Haber, MD

## 2015-03-09 LAB — URINE CULTURE: Colony Count: >=100000

## 2015-04-07 ENCOUNTER — Emergency Department (HOSPITAL_COMMUNITY)
Admission: EM | Admit: 2015-04-07 | Discharge: 2015-04-08 | Disposition: A | Discharge status: Home / Self Care | Payer: Medicare Other | Attending: Emergency Medicine | Admitting: Emergency Medicine

## 2015-04-07 ENCOUNTER — Emergency Department (HOSPITAL_COMMUNITY): Payer: Medicare Other

## 2015-04-07 ENCOUNTER — Encounter (HOSPITAL_COMMUNITY): Payer: Self-pay | Admitting: Emergency Medicine

## 2015-04-07 DIAGNOSIS — S59901A Unspecified injury of right elbow, initial encounter: Secondary | ICD-10-CM | POA: Diagnosis present

## 2015-04-07 DIAGNOSIS — Z79899 Other long term (current) drug therapy: Secondary | ICD-10-CM | POA: Diagnosis not present

## 2015-04-07 DIAGNOSIS — Y92017 Garden or yard in single-family (private) house as the place of occurrence of the external cause: Secondary | ICD-10-CM | POA: Insufficient documentation

## 2015-04-07 DIAGNOSIS — S0083XA Contusion of other part of head, initial encounter: Secondary | ICD-10-CM | POA: Diagnosis not present

## 2015-04-07 DIAGNOSIS — Z87448 Personal history of other diseases of urinary system: Secondary | ICD-10-CM | POA: Insufficient documentation

## 2015-04-07 DIAGNOSIS — W1839XA Other fall on same level, initial encounter: Secondary | ICD-10-CM | POA: Insufficient documentation

## 2015-04-07 DIAGNOSIS — S42401A Unspecified fracture of lower end of right humerus, initial encounter for closed fracture: Secondary | ICD-10-CM

## 2015-04-07 DIAGNOSIS — M25421 Effusion, right elbow: Secondary | ICD-10-CM | POA: Diagnosis not present

## 2015-04-07 DIAGNOSIS — Y998 Other external cause status: Secondary | ICD-10-CM | POA: Diagnosis not present

## 2015-04-07 DIAGNOSIS — S0990XA Unspecified injury of head, initial encounter: Secondary | ICD-10-CM | POA: Diagnosis not present

## 2015-04-07 DIAGNOSIS — S42491A Other displaced fracture of lower end of right humerus, initial encounter for closed fracture: Secondary | ICD-10-CM | POA: Diagnosis not present

## 2015-04-07 DIAGNOSIS — Z85810 Personal history of malignant neoplasm of tongue: Secondary | ICD-10-CM | POA: Diagnosis not present

## 2015-04-07 DIAGNOSIS — S098XXA Other specified injuries of head, initial encounter: Secondary | ICD-10-CM | POA: Diagnosis not present

## 2015-04-07 DIAGNOSIS — M25521 Pain in right elbow: Secondary | ICD-10-CM | POA: Diagnosis not present

## 2015-04-07 DIAGNOSIS — Z7982 Long term (current) use of aspirin: Secondary | ICD-10-CM | POA: Insufficient documentation

## 2015-04-07 DIAGNOSIS — R9431 Abnormal electrocardiogram [ECG] [EKG]: Secondary | ICD-10-CM | POA: Diagnosis not present

## 2015-04-07 DIAGNOSIS — Z8669 Personal history of other diseases of the nervous system and sense organs: Secondary | ICD-10-CM | POA: Insufficient documentation

## 2015-04-07 DIAGNOSIS — Z87891 Personal history of nicotine dependence: Secondary | ICD-10-CM | POA: Insufficient documentation

## 2015-04-07 DIAGNOSIS — S42441A Displaced fracture (avulsion) of medial epicondyle of right humerus, initial encounter for closed fracture: Secondary | ICD-10-CM | POA: Diagnosis not present

## 2015-04-07 DIAGNOSIS — Y9389 Activity, other specified: Secondary | ICD-10-CM | POA: Diagnosis not present

## 2015-04-07 MED ORDER — MORPHINE SULFATE 2 MG/ML IJ SOLN
2.0000 mg | Freq: Once | INTRAMUSCULAR | Status: AC
  Administered 2015-04-07: 2 mg via INTRAVENOUS
  Filled 2015-04-07: qty 1

## 2015-04-07 NOTE — ED Notes (Addendum)
Awake. Verbally responsive. A/O x4. Resp even and unlabored. No audible adventitious breath sounds noted. ABC's intact. SR on monitor. IV saline lock patent and intact. Pt assisted to standing position to urinate. Gait steady. Rt elbow continues to be swollen. Ice pack applied. Family at bedside.

## 2015-04-07 NOTE — ED Notes (Signed)
Awake. Verbally responsive. A/O x4. Resp even and unlabored. No audible adventitious breath sounds noted. ABC's intact. Family at bedside. 

## 2015-04-07 NOTE — ED Notes (Signed)
Bed: DB52 Expected date:  Expected time:  Means of arrival:  Comments: EMS 81yo M fall outside, bruise to parietal area and rt elbow

## 2015-04-07 NOTE — ED Notes (Signed)
Awake. Verbally responsive. A/O x4. Resp even and unlabored. No audible adventitious breath sounds noted. ABC's intact. SR on monitor. 

## 2015-04-07 NOTE — ED Notes (Signed)
Pt arrived via EMS on stretcher with complaints of rt elbow swelling, hematoma to rt paritial, and abrasion to lt lateral calf. Pt ambulating short distance with assistance. Pt reported slipping and falling while leaf blowing. Pt reported that he fell against a fence and bushes with unknown as to how long was on the ground. Denies pain. (-)LOC, dizziness/lightheadedness or visual distrubances. (+)PMS, CRT brisk, and LROM to rt elbow. MAEE.

## 2015-04-07 NOTE — ED Provider Notes (Signed)
CSN: [id number]     Arrival date & time 04/07/15  2114 History   First MD Initiated Contact with Patient 04/07/15 2128     Chief Complaint  Patient presents with  . Fall     (Consider location/radiation/quality/duration/timing/severity/associated sxs/prior Treatment) HPI The patient had been working in his yard and trying to do some relief blowing. He reports that he got in such a position where he lost his balance and fell against a fence and then wasn't able to get back up. He reports that he did not get knocked out. He denies a headache. He had to crawl out to the driveway to get assistance. He reports that the most painful area he has is his right elbow which is very swollen. Other than that he denies any significant associated pain. Past Medical History  Diagnosis Date  . BPH (benign prostatic hyperplasia)   . H/O tongue cancer   . Cataract    Past Surgical History  Procedure Laterality Date  . Tonsillectomy and adenoidectomy    . Transurethral resection of prostate    . Appendectomy    . Eye surgery     History reviewed. No pertinent family history. History  Substance Use Topics  . Smoking status: Former Research scientist (life sciences)  . Smokeless tobacco: Not on file  . Alcohol Use: No    Review of Systems 10 Systems reviewed and are negative for acute change except as noted in the HPI.    Allergies  Review of patient's allergies indicates no known allergies.  Home Medications   Prior to Admission medications   Medication Sig Start Date End Date Taking? Authorizing Provider  aspirin EC 81 MG tablet Take 81 mg by mouth daily.   Yes Historical Provider, MD  calcium carbonate (TUMS - DOSED IN MG ELEMENTAL CALCIUM) 500 MG chewable tablet Chew 1 tablet by mouth 3 (three) times daily as needed for indigestion or heartburn.   Yes Historical Provider, MD  calcium citrate-vitamin D (CITRACAL+D) 315-200 MG-UNIT per tablet Take 1 tablet by mouth 2 (two) times daily.   Yes Historical Provider, MD   Naproxen Sod-Diphenhydramine 220-25 MG TABS Take 1 tablet by mouth at bedtime as needed (sleep).   Yes Historical Provider, MD  ciprofloxacin (CIPRO) 250 MG tablet Take 1 tablet (250 mg total) by mouth 2 (two) times daily. Patient not taking: Reported on 04/07/2015 03/05/15   Robyn Haber, MD  docusate sodium (COLACE) 100 MG capsule Take 1 capsule (100 mg total) by mouth every 12 (twelve) hours. 04/08/15   Charlesetta Shanks, MD  HYDROcodone-acetaminophen (NORCO/VICODIN) 5-325 MG per tablet Take 1-2 tablets by mouth every 4 (four) hours as needed for moderate pain or severe pain. 04/08/15   Charlesetta Shanks, MD  omeprazole (PRILOSEC) 20 MG capsule Take 1 capsule (20 mg total) by mouth daily. Patient not taking: Reported on 03/05/2015 09/10/12   Dionne Bucy McClung, PA-C   BP 113/58 mmHg  Pulse 90  Temp(Src) 98.2 F (36.8 C) (Oral)  Resp 18  SpO2 98% Physical Exam  Constitutional: He is oriented to person, place, and time. He appears well-developed and well-nourished.  HENT:  Patient has a contusion to the top of the right scalp. It is approximately 2 cm. No associated abrasion or laceration.  Eyes: EOM are normal. Pupils are equal, round, and reactive to light.  Neck: Neck supple.  Cardiovascular: Normal rate, regular rhythm, normal heart sounds and intact distal pulses.   Pulmonary/Chest: Effort normal and breath sounds normal.  Abdominal: Soft. Bowel sounds  are normal. He exhibits no distension. There is no tenderness.  Musculoskeletal: Normal range of motion. He exhibits edema and tenderness.  Patient has a very swollen right elbow. He identifies significant pain with any kind of range of motion. Radial pulses intact. Grip strength is intact. Patient has excellent range of motion of the lower extremities he is able to flex at the hips and push me away with good strength bilaterally no pain to compression over the hips or the trochanters.  Neurological: He is alert and oriented to person, place, and  time. He has normal strength. Coordination normal. GCS eye subscore is 4. GCS verbal subscore is 5. GCS motor subscore is 6.  Skin: Skin is warm, dry and intact.  Psychiatric: He has a normal mood and affect.    ED Course  Procedures (including critical care time) Labs Review Labs Reviewed  BASIC METABOLIC PANEL  CBC WITH DIFFERENTIAL/PLATELET  PROTIME-INR  TYPE AND SCREEN    Imaging Review Dg Forearm Right  04/08/2015   CLINICAL DATA:  Slip and fall while using leaf blower. Fell against fence. No loss of consciousness.  EXAM: RIGHT FOREARM - 2 VIEW  COMPARISON:  None.  FINDINGS: Partially imaged distal humerus displaced intercondylar fracture. No dislocation. Radius and ulna are intact. Osteopenia without destructive bony lesions. Elbow effusion soft tissue swelling partially imaged.  IMPRESSION: Acute displaced distal humerus intercondylar fracture would be better characterized on dedicated elbow radiographs.  Osteopenia decreases sensitivity for acute nondisplaced fractures.   Electronically Signed   By: Elon Alas   On: 04/08/2015 00:06   Ct Head Wo Contrast  04/07/2015   CLINICAL DATA:  Fall with parietal bruising.  Initial encounter.  EXAM: CT HEAD WITHOUT CONTRAST  TECHNIQUE: Contiguous axial images were obtained from the base of the skull through the vertex without intravenous contrast.  COMPARISON:  None.  FINDINGS: Skull and Sinuses:Negative for fracture or destructive process. The mastoids, middle ears, and imaged paranasal sinuses are clear.  Orbits: Bilateral cataract resection.  No traumatic findings.  Brain: No evidence of acute infarction, hemorrhage, hydrocephalus, or mass lesion/mass effect. There is generalized cerebral volume loss with ex vacuo ventricular enlargement. Remote small cortical and subcortical infarct in the left parietal lobe.  IMPRESSION: No evidence of intracranial injury.   Electronically Signed   By: [name]   On: 04/07/2015 23:31      EKG Interpretation   Date/Time:  Sunday April 07 2015 22:52:34 EDT Ventricular Rate:  97 PR Interval:  238 QRS Duration: 69 QT Interval:  359 QTC Calculation: 456 R Axis:   58 Text Interpretation:  Sinus rhythm Prolonged PR interval Abnormal R-wave  progression, early transition agree. no STEMI Confirmed by Johnney Killian, MD,  Jeannie Done [phone]) on 04/07/2015 10:58:50 PM     Consult: Patient's case was reviewed Dr. Sharol Given. He reports at this time the elbow fracture can be managed with a posterior splint and follow-up in the office. MDM   Final diagnoses:  Elbow fracture, right, closed, initial encounter  Minor head injury, initial encounter   Patient had a mechanical fall today. He has no complaints of headache, confusion, cervical pain. CT head does not show any abnormality. Patient does have a significant elbow fracture. At this point time posterior splint will be placed. Patient has been advised of signs and symptoms to return and given information for follow-up with Dr. Sharol Given this week.    Charlesetta Shanks, MD 04/08/15 0030

## 2015-04-08 LAB — BASIC METABOLIC PANEL
Anion gap: 8 (ref 5–15)
BUN: 34 mg/dL — ABNORMAL HIGH (ref 6–23)
CO2: 25 mmol/L (ref 19–32)
CREATININE: 1.26 mg/dL (ref 0.50–1.35)
Calcium: 9.4 mg/dL (ref 8.4–10.5)
Chloride: 108 mmol/L (ref 96–112)
GFR calc non Af Amer: 47 mL/min — ABNORMAL LOW (ref >90)
GFR, EST AFRICAN AMERICAN: 54 mL/min — AB (ref >90)
Glucose, Bld: 151 mg/dL — ABNORMAL HIGH (ref 70–99)
POTASSIUM: 4.3 mmol/L (ref 3.5–5.1)
Sodium: 141 mmol/L (ref 135–145)

## 2015-04-08 LAB — CBC WITH DIFFERENTIAL/PLATELET
Basophils Absolute: 0 10*3/uL (ref 0.0–0.1)
Basophils Relative: 0 % (ref 0–1)
Eosinophils Absolute: 0 10*3/uL (ref 0.0–0.7)
Eosinophils Relative: 0 % (ref 0–5)
HCT: 41.1 % (ref 39.0–52.0)
Hemoglobin: 13.8 g/dL (ref 13.0–17.0)
LYMPHS ABS: 0.6 10*3/uL — AB (ref 0.7–4.0)
Lymphocytes Relative: 6 % — ABNORMAL LOW (ref 12–46)
MCH: 33.1 pg (ref 26.0–34.0)
MCHC: 33.6 g/dL (ref 30.0–36.0)
MCV: 98.6 fL (ref 78.0–100.0)
MONOS PCT: 9 % (ref 3–12)
Monocytes Absolute: 1 10*3/uL (ref 0.1–1.0)
NEUTROS ABS: 8.8 10*3/uL — AB (ref 1.7–7.7)
Neutrophils Relative %: 85 % — ABNORMAL HIGH (ref 43–77)
Platelets: 188 10*3/uL (ref 150–400)
RBC: 4.17 MIL/uL — ABNORMAL LOW (ref 4.22–5.81)
RDW: 12.1 % (ref 11.5–15.5)
WBC: 10.4 10*3/uL (ref 4.0–10.5)

## 2015-04-08 LAB — PROTIME-INR
INR: 1.05 (ref 0.00–1.49)
Prothrombin Time: 13.8 seconds (ref 11.6–15.2)

## 2015-04-08 LAB — TYPE AND SCREEN
ABO/RH(D): O POS
Antibody Screen: NEGATIVE

## 2015-04-08 LAB — ABO/RH: ABO/RH(D): O POS

## 2015-04-08 MED ORDER — ACETAMINOPHEN 500 MG PO TABS
500.0000 mg | ORAL_TABLET | Freq: Once | ORAL | Status: AC
  Administered 2015-04-08: 500 mg via ORAL
  Filled 2015-04-08: qty 1

## 2015-04-08 MED ORDER — MORPHINE SULFATE 4 MG/ML IJ SOLN
4.0000 mg | Freq: Once | INTRAMUSCULAR | Status: AC
Start: 2015-04-08 — End: 2015-04-08
  Administered 2015-04-08: 4 mg via INTRAVENOUS
  Filled 2015-04-08: qty 1

## 2015-04-08 MED ORDER — DOCUSATE SODIUM 100 MG PO CAPS: 100.0000 mg | ORAL_CAPSULE | Freq: Two times a day (BID) | ORAL | Status: DC

## 2015-04-08 MED ORDER — HYDROCODONE-ACETAMINOPHEN 5-325 MG PO TABS: 1.0000 | ORAL_TABLET | ORAL | Status: DC | PRN

## 2015-04-08 MED ORDER — HYDROCODONE-ACETAMINOPHEN 5-325 MG PO TABS
1.0000 | ORAL_TABLET | Freq: Once | ORAL | Status: AC
  Administered 2015-04-08: 1 via ORAL
  Filled 2015-04-08: qty 1

## 2015-04-08 NOTE — ED Notes (Signed)
Ortho tech at bedside to apply splint. Pt tolerating well.

## 2015-04-08 NOTE — Progress Notes (Signed)
Orthopedic Tech Progress Note Patient Details:  Nicholas Andrews [birth year] [id number]  Ortho Devices Type of Ortho Device: Arm sling, Long arm splint Ortho Device/Splint Interventions: Application   [name] 04/08/2015, 1:16 AM

## 2015-04-08 NOTE — ED Notes (Signed)
Awake. Verbally responsive. A/O x4. Resp even and unlabored. No audible adventitious breath sounds noted. ABC's intact. SR on monitor. IV patent and intact. Family at bedside. MD at bedside.

## 2015-04-08 NOTE — ED Notes (Signed)
Awake. Verbally responsive. A/O x4. Resp even and unlabored. No audible adventitious breath sounds noted. ABC's intact. Sling immobilizer and splint intact. Pt able to move fingers. CRT brisk.

## 2015-04-08 NOTE — Discharge Instructions (Signed)
Head Injury °You have received a head injury. It does not appear serious at this time. Headaches and vomiting are common following head injury. It should be easy to awaken from sleeping. Sometimes it is necessary for you to stay in the emergency department for a while for observation. Sometimes admission to the hospital may be needed. After injuries such as yours, most problems occur within the first 24 hours, but side effects may occur up to 7-10 days after the injury. It is important for you to carefully monitor your condition and contact your health care provider or seek immediate medical care if there is a change in your condition. °WHAT ARE THE TYPES OF HEAD INJURIES? °Head injuries can be as minor as a bump. Some head injuries can be more severe. More severe head injuries include: °· A jarring injury to the brain (concussion). °· A bruise of the brain (contusion). This mean there is bleeding in the brain that can cause swelling. °· A cracked skull (skull fracture). °· Bleeding in the brain that collects, clots, and forms a bump (hematoma). °WHAT CAUSES A HEAD INJURY? °A serious head injury is most likely to happen to someone who is in a car wreck and is not wearing a seat belt. Other causes of major head injuries include bicycle or motorcycle accidents, sports injuries, and falls. °HOW ARE HEAD INJURIES DIAGNOSED? °A complete history of the event leading to the injury and your current symptoms will be helpful in diagnosing head injuries. Many times, pictures of the brain, such as CT or MRI are needed to see the extent of the injury. Often, an overnight hospital stay is necessary for observation.  °WHEN SHOULD I SEEK IMMEDIATE MEDICAL CARE?  °You should get help right away if: °· You have confusion or drowsiness. °· You feel sick to your stomach (nauseous) or have continued, forceful vomiting. °· You have dizziness or unsteadiness that is getting worse. °· You have severe, continued headaches not relieved by  medicine. Only take over-the-counter or prescription medicines for pain, fever, or discomfort as directed by your health care provider. °· You do not have normal function of the arms or legs or are unable to walk. °· You notice changes in the black spots in the center of the colored part of your eye (pupil). °· You have a clear or bloody fluid coming from your nose or ears. °· You have a loss of vision. °During the next 24 hours after the injury, you must stay with someone who can watch you for the warning signs. This person should contact local emergency services (911 in the [place] if you have seizures, you become unconscious, or you are unable to wake up. °HOW CAN I PREVENT A HEAD INJURY IN THE FUTURE? °The most important factor for preventing major head injuries is avoiding motor vehicle accidents.  To minimize the potential for damage to your head, it is crucial to wear seat belts while riding in motor vehicles. Wearing helmets while bike riding and playing collision sports (like football) is also helpful. Also, avoiding dangerous activities around the house will further help reduce your risk of head injury.  °WHEN CAN I RETURN TO NORMAL ACTIVITIES AND ATHLETICS? °You should be reevaluated by your health care provider before returning to these activities. If you have any of the following symptoms, you should not return to activities or contact sports until 1 week after the symptoms have stopped: °· Persistent headache. °· Dizziness or vertigo. °· Poor attention and concentration. °· Confusion. °·   Memory problems.  Nausea or vomiting.  Fatigue or tire easily.  Irritability.  Intolerant of bright lights or loud noises.  Anxiety or depression.  Disturbed sleep. MAKE SURE YOU:   Understand these instructions.  Will watch your condition.  Will get help right away if you are not doing well or get worse. Document Released: 12/14/2005 Document Revised: 12/19/2013 Document Reviewed:  08/21/2013 [employer] Patient Information 2015 [place], [place]. This information is not intended to replace advice given to you by your health care provider. Make sure you discuss any questions you have with your health care provider. Elbow Fracture, Distal Humerus with Rehab A break (fracture) of the elbow occurs in one or more of the three bones of the elbow: the arm bone (humerus), the end of the thumb-side forearm bone (radial head) or the end of the pinky-side forearm bone (olecranon). Elbow fractures may be complete or incomplete. The following discussion does not address radial head fractures. SYMPTOMS   Severe elbow and arm pain at the time of injury.  Tenderness, inflammation, and later bruising (contusion) of the elbow (within 48 hours).  Visible deformity if the fracture is complete and the bone fragments are not aligned properly (displaced).  Numbness, coldness, or paralysis in the elbow, forearm, or hand from pressure on the blood vessels or nerves (uncommon). CAUSES  An elbow fracture occurs when a force is placed on the bone that is greater than it can withstand. Typical mechanisms of injury include:  Direct trauma to the elbow.  Twisting injury to the elbow.  Indirect stress due to falling on an outstretched hand with the elbow locked. RISK INCREASES WITH:   Contact sports (football or rugby).  Children under 60 years of age.  History of bone or joint disease (i.e., osteoporosis or bone tumor).  Poor strength and flexibility. PREVENTION   Warm up and stretch properly before activity.  Maintain physical fitness:  Strength, flexibility, and endurance.  Cardiovascular fitness.  When appropriate, wear properly fitted and padded elbow protection. PROGNOSIS  If treated properly, elbow fractures typically heal within 6 to 8 weeks for adults and 4 to 6 weeks in children.  RELATED COMPLICATIONS   The ends of the fractured bone do not come together  (nonunion).  Improper alignment after healing (malunion) of the fracture.  Chronic pain, stiffness, loss of motion, or swelling of the elbow.  Excessive bleeding in the elbow or at the fracture site, causing pressure and injury to nerves and blood vessels (uncommon).  Calcification of the soft tissues around the elbow (heterotopic ossification).  Risk of bone death due to interrupted blood supply associated with the fracture.  Unstable or arthritic joint following repeated injury.  Arrest of normal bone growth in children.  Atrophy, weakness, stiffness, numbness, and poor control of the hand due to injury to blood vessels, nerves, cartilage, muscle, ligaments, and fascia. TREATMENT  If the fracture is displaced, it must be put back in proper alignment (reduced) by an individual trained in the procedure. Oftentimes, displaced fractures cannot be reduced manually and require surgery. Once the bones are properly aligned (with or without surgery), ice and medication can be used to help reduce the pain and inflammation. The elbow should be immobilized for at least 6 weeks. After immobilization it is important to complete strengthening and stretching exercises to regain strength and a full range of motion. Theses exercises may be completed at home or with a therapist.  MEDICATION   If pain medication is necessary, then nonsteroidal anti-inflammatory medications,  such as aspirin and ibuprofen, or other minor pain relievers, such as acetaminophen, are often recommended.  Do not take pain medication within 7 days before surgery.  Prescription pain relievers may be given if deemed necessary by your caregiver. Use only as directed and only as much as you need. COLD THERAPY  Cold treatment (icing) relieves pain and reduces inflammation. Cold treatment should be applied for 10 to 15 minutes every 2 to 3 hours for inflammation and pain and immediately after any activity that aggravates your symptoms.  Use ice packs or an ice massage. SEEK MEDICAL CARE IF:  Pain, tenderness, or swelling worsens despite treatment.  You experience pain, numbness, or coldness in the hand.  Blue, gray, or dusky color appears in the fingernails.  Any of the following occur after surgery: fever, increased pain, swelling, redness, drainage, or bleeding in the surgical area.

## 2015-04-10 ENCOUNTER — Emergency Department (HOSPITAL_COMMUNITY)
Admission: EM | Admit: 2015-04-10 | Discharge: 2015-04-10 | Disposition: A | Discharge status: Home / Self Care | Payer: Medicare Other | Attending: Emergency Medicine | Admitting: Emergency Medicine

## 2015-04-10 ENCOUNTER — Encounter (HOSPITAL_COMMUNITY): Payer: Self-pay | Admitting: Emergency Medicine

## 2015-04-10 ENCOUNTER — Emergency Department (HOSPITAL_COMMUNITY): Payer: Medicare Other

## 2015-04-10 DIAGNOSIS — S42441A Displaced fracture (avulsion) of medial epicondyle of right humerus, initial encounter for closed fracture: Secondary | ICD-10-CM | POA: Diagnosis not present

## 2015-04-10 DIAGNOSIS — Z87448 Personal history of other diseases of urinary system: Secondary | ICD-10-CM | POA: Insufficient documentation

## 2015-04-10 DIAGNOSIS — Z87828 Personal history of other (healed) physical injury and trauma: Secondary | ICD-10-CM | POA: Insufficient documentation

## 2015-04-10 DIAGNOSIS — Z79899 Other long term (current) drug therapy: Secondary | ICD-10-CM | POA: Insufficient documentation

## 2015-04-10 DIAGNOSIS — Z87891 Personal history of nicotine dependence: Secondary | ICD-10-CM | POA: Diagnosis not present

## 2015-04-10 DIAGNOSIS — Z8781 Personal history of (healed) traumatic fracture: Secondary | ICD-10-CM | POA: Diagnosis not present

## 2015-04-10 DIAGNOSIS — Z9849 Cataract extraction status, unspecified eye: Secondary | ICD-10-CM | POA: Diagnosis not present

## 2015-04-10 DIAGNOSIS — Z7982 Long term (current) use of aspirin: Secondary | ICD-10-CM | POA: Diagnosis not present

## 2015-04-10 DIAGNOSIS — M25521 Pain in right elbow: Secondary | ICD-10-CM

## 2015-04-10 DIAGNOSIS — Z85810 Personal history of malignant neoplasm of tongue: Secondary | ICD-10-CM | POA: Diagnosis not present

## 2015-04-10 DIAGNOSIS — Z792 Long term (current) use of antibiotics: Secondary | ICD-10-CM | POA: Diagnosis not present

## 2015-04-10 DIAGNOSIS — R600 Localized edema: Secondary | ICD-10-CM | POA: Diagnosis present

## 2015-04-10 NOTE — ED Provider Notes (Signed)
CSN: [id number]     Arrival date & time 04/10/15  1559 History   First MD Initiated Contact with Patient 04/10/15 1627     Chief Complaint  Patient presents with  . Edema Post Fracture      (Consider location/radiation/quality/duration/timing/severity/associated sxs/prior Treatment) HPI Patient presents 3 days after being evaluated for injury sustained during a fall, now with concern of right elbow pain, swelling throughout the right upper extremity. Patient had mechanical fall 3 days ago. Patient immobilization to right elbow following discovery of fracture applied on that day. Patient is scheduled to see orthopedics tomorrow. Since placement of the splint the patient has felt swelling, ecchymosis throughout the right distal arm, with dysesthesia in the hand. No complete loss of sensation or strength in the hand, wrist. No other new injuries, no other new complaints. Symptoms are relieved with OTC medication, rest, worse with palpation or activity.  Past Medical History  Diagnosis Date  . BPH (benign prostatic hyperplasia)   . H/O tongue cancer   . Cataract    Past Surgical History  Procedure Laterality Date  . Tonsillectomy and adenoidectomy    . Transurethral resection of prostate    . Appendectomy    . Eye surgery     History reviewed. No pertinent family history. History  Substance Use Topics  . Smoking status: Former Research scientist (life sciences)  . Smokeless tobacco: Not on file  . Alcohol Use: No    Review of Systems  Constitutional:       Per HPI, otherwise negative  Respiratory:       Per HPI, otherwise negative  Cardiovascular:       Per HPI, otherwise negative  Gastrointestinal: Negative for nausea.  Musculoskeletal:       Per HPI, otherwise negative  Skin: Positive for color change.  Allergic/Immunologic: Negative for immunocompromised state.  Neurological: Negative for weakness and numbness.  Hematological: Bruises/bleeds easily.  Psychiatric/Behavioral: Negative for  confusion.      Allergies  Review of patient's allergies indicates no known allergies.  Home Medications   Prior to Admission medications   Medication Sig Start Date End Date Taking? Authorizing Provider  aspirin EC 81 MG tablet Take 81 mg by mouth daily.   Yes Historical Provider, MD  calcium citrate-vitamin D (CITRACAL+D) 315-200 MG-UNIT per tablet Take 1 tablet by mouth 2 (two) times daily.   Yes Historical Provider, MD  docusate sodium (COLACE) 100 MG capsule Take 1 capsule (100 mg total) by mouth every 12 (twelve) hours. 04/08/15  Yes Charlesetta Shanks, MD  HYDROcodone-acetaminophen (NORCO/VICODIN) 5-325 MG per tablet Take 1-2 tablets by mouth every 4 (four) hours as needed for moderate pain or severe pain. 04/08/15  Yes Charlesetta Shanks, MD  ciprofloxacin (CIPRO) 250 MG tablet Take 1 tablet (250 mg total) by mouth 2 (two) times daily. Patient not taking: Reported on 04/07/2015 03/05/15   Robyn Haber, MD  Naproxen Sod-Diphenhydramine 220-25 MG TABS Take 1 tablet by mouth at bedtime as needed (sleep).    Historical Provider, MD  omeprazole (PRILOSEC) 20 MG capsule Take 1 capsule (20 mg total) by mouth daily. Patient not taking: Reported on 03/05/2015 09/10/12   Dionne Bucy McClung, PA-C   BP 126/59 mmHg  Pulse 79  Temp(Src) 97.5 F (36.4 C) (Oral)  Resp 18  SpO2 98% Physical Exam  Constitutional: He is oriented to person, place, and time. He appears well-developed. No distress.  HENT:  Head: Normocephalic and atraumatic.  Eyes: Conjunctivae and EOM are normal.  Cardiovascular: Normal rate,  regular rhythm and intact distal pulses.   Pulmonary/Chest: Effort normal. No stridor. No respiratory distress.  Abdominal: He exhibits no distension.  Musculoskeletal: He exhibits no edema.  The entirety of the right arm is ecchymotic, swollen. Patient can flex and extend the wrist, flex and extend all digits, range of motion is minimally changed in the hand, wrist. No pain w passive motion of the  wrist, hand. Patient will not flex the elbow spontaneously. Shoulder is grossly unremarkable.   Neurological: He is alert and oriented to person, place, and time. He displays no tremor. He displays no seizure activity.  Sensation and strength are appropriate in the right hand, wrist.   Skin: Skin is warm and dry. There is erythema.     Psychiatric: He has a normal mood and affect.  Nursing note and vitals reviewed.   ED Course  Procedures (including critical care time)  Imaging Review  I reviewed the imaging, agree with the interpretation.   I reviewed the patient's chart from several days ago following the fall. Patient had a new right elbow fracture.  On repeat exam I discussed all findings the patient and his daughter.  They will follow up with orthopedics tomorrow. Splint was reapplied.   MDM   Final diagnoses:  Elbow pain, right    Patient presents several days after mechanical fall that resulted in elbow fracture. Return here patient is awake, alert, no distal neurovascular compromise. Patient does have notable hematoma, ecchymosis, w/o e/o compartment syndrome.  He has F/U within 24 hours and was d/c w home care instructions / return precautions.     Carmin Muskrat, MD 04/10/15 [phone]

## 2015-04-10 NOTE — Discharge Instructions (Signed)
As discussed, your evaluation today has been largely reassuring.  But, it is important that you monitor your condition carefully, and do not hesitate to return to the ED if you develop new, or concerning changes in your condition. ? ?Otherwise, please follow-up with your physician for appropriate ongoing care. ? ?

## 2015-04-10 NOTE — ED Notes (Addendum)
Pt c/o right hand swelling and ecchymosis after right elbow fracture on 04-07-15. Pt worried about amount of swelling. Pt was instructed to arrange follow-up appointment, but hasn't been able to get in today. Radial pulse 2+, capillary refill good, sensation and motor function intact.

## 2015-04-11 DIAGNOSIS — S42411A Displaced simple supracondylar fracture without intercondylar fracture of right humerus, initial encounter for closed fracture: Secondary | ICD-10-CM | POA: Diagnosis not present

## 2015-04-22 DIAGNOSIS — S42411D Displaced simple supracondylar fracture without intercondylar fracture of right humerus, subsequent encounter for fracture with routine healing: Secondary | ICD-10-CM | POA: Diagnosis not present

## 2015-04-29 DIAGNOSIS — S42411D Displaced simple supracondylar fracture without intercondylar fracture of right humerus, subsequent encounter for fracture with routine healing: Secondary | ICD-10-CM | POA: Diagnosis not present

## 2015-05-10 ENCOUNTER — Encounter: Payer: Self-pay | Admitting: Gastroenterology

## 2015-05-10 ENCOUNTER — Encounter: Payer: Self-pay | Admitting: Internal Medicine

## 2015-05-11 DIAGNOSIS — Z87891 Personal history of nicotine dependence: Secondary | ICD-10-CM | POA: Diagnosis not present

## 2015-05-11 DIAGNOSIS — R269 Unspecified abnormalities of gait and mobility: Secondary | ICD-10-CM | POA: Diagnosis not present

## 2015-05-11 DIAGNOSIS — S42414D Nondisplaced simple supracondylar fracture without intercondylar fracture of right humerus, subsequent encounter for fracture with routine healing: Secondary | ICD-10-CM | POA: Diagnosis not present

## 2015-05-11 DIAGNOSIS — W19XXXD Unspecified fall, subsequent encounter: Secondary | ICD-10-CM | POA: Diagnosis not present

## 2015-05-13 DIAGNOSIS — W19XXXD Unspecified fall, subsequent encounter: Secondary | ICD-10-CM | POA: Diagnosis not present

## 2015-05-13 DIAGNOSIS — S42414D Nondisplaced simple supracondylar fracture without intercondylar fracture of right humerus, subsequent encounter for fracture with routine healing: Secondary | ICD-10-CM | POA: Diagnosis not present

## 2015-05-13 DIAGNOSIS — Z87891 Personal history of nicotine dependence: Secondary | ICD-10-CM | POA: Diagnosis not present

## 2015-05-13 DIAGNOSIS — R269 Unspecified abnormalities of gait and mobility: Secondary | ICD-10-CM | POA: Diagnosis not present

## 2015-05-15 DIAGNOSIS — R269 Unspecified abnormalities of gait and mobility: Secondary | ICD-10-CM | POA: Diagnosis not present

## 2015-05-15 DIAGNOSIS — Z87891 Personal history of nicotine dependence: Secondary | ICD-10-CM | POA: Diagnosis not present

## 2015-05-15 DIAGNOSIS — W19XXXD Unspecified fall, subsequent encounter: Secondary | ICD-10-CM | POA: Diagnosis not present

## 2015-05-15 DIAGNOSIS — S42414D Nondisplaced simple supracondylar fracture without intercondylar fracture of right humerus, subsequent encounter for fracture with routine healing: Secondary | ICD-10-CM | POA: Diagnosis not present

## 2015-05-16 DIAGNOSIS — Z87891 Personal history of nicotine dependence: Secondary | ICD-10-CM | POA: Diagnosis not present

## 2015-05-16 DIAGNOSIS — R269 Unspecified abnormalities of gait and mobility: Secondary | ICD-10-CM | POA: Diagnosis not present

## 2015-05-16 DIAGNOSIS — S42414D Nondisplaced simple supracondylar fracture without intercondylar fracture of right humerus, subsequent encounter for fracture with routine healing: Secondary | ICD-10-CM | POA: Diagnosis not present

## 2015-05-16 DIAGNOSIS — W19XXXD Unspecified fall, subsequent encounter: Secondary | ICD-10-CM | POA: Diagnosis not present

## 2015-05-16 DIAGNOSIS — S42411D Displaced simple supracondylar fracture without intercondylar fracture of right humerus, subsequent encounter for fracture with routine healing: Secondary | ICD-10-CM | POA: Diagnosis not present

## 2015-05-17 DIAGNOSIS — Z87891 Personal history of nicotine dependence: Secondary | ICD-10-CM | POA: Diagnosis not present

## 2015-05-17 DIAGNOSIS — W19XXXD Unspecified fall, subsequent encounter: Secondary | ICD-10-CM | POA: Diagnosis not present

## 2015-05-17 DIAGNOSIS — R269 Unspecified abnormalities of gait and mobility: Secondary | ICD-10-CM | POA: Diagnosis not present

## 2015-05-17 DIAGNOSIS — S42414D Nondisplaced simple supracondylar fracture without intercondylar fracture of right humerus, subsequent encounter for fracture with routine healing: Secondary | ICD-10-CM | POA: Diagnosis not present

## 2015-05-20 DIAGNOSIS — W19XXXD Unspecified fall, subsequent encounter: Secondary | ICD-10-CM | POA: Diagnosis not present

## 2015-05-20 DIAGNOSIS — R269 Unspecified abnormalities of gait and mobility: Secondary | ICD-10-CM | POA: Diagnosis not present

## 2015-05-20 DIAGNOSIS — S42414D Nondisplaced simple supracondylar fracture without intercondylar fracture of right humerus, subsequent encounter for fracture with routine healing: Secondary | ICD-10-CM | POA: Diagnosis not present

## 2015-05-20 DIAGNOSIS — Z87891 Personal history of nicotine dependence: Secondary | ICD-10-CM | POA: Diagnosis not present

## 2015-05-21 DIAGNOSIS — Z87891 Personal history of nicotine dependence: Secondary | ICD-10-CM | POA: Diagnosis not present

## 2015-05-21 DIAGNOSIS — R269 Unspecified abnormalities of gait and mobility: Secondary | ICD-10-CM | POA: Diagnosis not present

## 2015-05-21 DIAGNOSIS — W19XXXD Unspecified fall, subsequent encounter: Secondary | ICD-10-CM | POA: Diagnosis not present

## 2015-05-21 DIAGNOSIS — S42414D Nondisplaced simple supracondylar fracture without intercondylar fracture of right humerus, subsequent encounter for fracture with routine healing: Secondary | ICD-10-CM | POA: Diagnosis not present

## 2015-05-22 DIAGNOSIS — S42414D Nondisplaced simple supracondylar fracture without intercondylar fracture of right humerus, subsequent encounter for fracture with routine healing: Secondary | ICD-10-CM | POA: Diagnosis not present

## 2015-05-22 DIAGNOSIS — R269 Unspecified abnormalities of gait and mobility: Secondary | ICD-10-CM | POA: Diagnosis not present

## 2015-05-22 DIAGNOSIS — Z87891 Personal history of nicotine dependence: Secondary | ICD-10-CM | POA: Diagnosis not present

## 2015-05-22 DIAGNOSIS — W19XXXD Unspecified fall, subsequent encounter: Secondary | ICD-10-CM | POA: Diagnosis not present

## 2015-05-23 DIAGNOSIS — S42414D Nondisplaced simple supracondylar fracture without intercondylar fracture of right humerus, subsequent encounter for fracture with routine healing: Secondary | ICD-10-CM | POA: Diagnosis not present

## 2015-05-23 DIAGNOSIS — W19XXXD Unspecified fall, subsequent encounter: Secondary | ICD-10-CM | POA: Diagnosis not present

## 2015-05-23 DIAGNOSIS — R269 Unspecified abnormalities of gait and mobility: Secondary | ICD-10-CM | POA: Diagnosis not present

## 2015-05-23 DIAGNOSIS — Z87891 Personal history of nicotine dependence: Secondary | ICD-10-CM | POA: Diagnosis not present

## 2015-05-24 DIAGNOSIS — S42414D Nondisplaced simple supracondylar fracture without intercondylar fracture of right humerus, subsequent encounter for fracture with routine healing: Secondary | ICD-10-CM | POA: Diagnosis not present

## 2015-05-24 DIAGNOSIS — W19XXXD Unspecified fall, subsequent encounter: Secondary | ICD-10-CM | POA: Diagnosis not present

## 2015-05-24 DIAGNOSIS — R269 Unspecified abnormalities of gait and mobility: Secondary | ICD-10-CM | POA: Diagnosis not present

## 2015-05-24 DIAGNOSIS — Z87891 Personal history of nicotine dependence: Secondary | ICD-10-CM | POA: Diagnosis not present

## 2015-05-28 DIAGNOSIS — Z87891 Personal history of nicotine dependence: Secondary | ICD-10-CM | POA: Diagnosis not present

## 2015-05-28 DIAGNOSIS — R269 Unspecified abnormalities of gait and mobility: Secondary | ICD-10-CM | POA: Diagnosis not present

## 2015-05-28 DIAGNOSIS — W19XXXD Unspecified fall, subsequent encounter: Secondary | ICD-10-CM | POA: Diagnosis not present

## 2015-05-28 DIAGNOSIS — S42414D Nondisplaced simple supracondylar fracture without intercondylar fracture of right humerus, subsequent encounter for fracture with routine healing: Secondary | ICD-10-CM | POA: Diagnosis not present

## 2015-05-29 DIAGNOSIS — W19XXXD Unspecified fall, subsequent encounter: Secondary | ICD-10-CM | POA: Diagnosis not present

## 2015-05-29 DIAGNOSIS — S42414D Nondisplaced simple supracondylar fracture without intercondylar fracture of right humerus, subsequent encounter for fracture with routine healing: Secondary | ICD-10-CM | POA: Diagnosis not present

## 2015-05-29 DIAGNOSIS — Z87891 Personal history of nicotine dependence: Secondary | ICD-10-CM | POA: Diagnosis not present

## 2015-05-29 DIAGNOSIS — R269 Unspecified abnormalities of gait and mobility: Secondary | ICD-10-CM | POA: Diagnosis not present

## 2015-05-30 DIAGNOSIS — R269 Unspecified abnormalities of gait and mobility: Secondary | ICD-10-CM | POA: Diagnosis not present

## 2015-05-30 DIAGNOSIS — W19XXXD Unspecified fall, subsequent encounter: Secondary | ICD-10-CM | POA: Diagnosis not present

## 2015-05-30 DIAGNOSIS — Z87891 Personal history of nicotine dependence: Secondary | ICD-10-CM | POA: Diagnosis not present

## 2015-05-30 DIAGNOSIS — S42414D Nondisplaced simple supracondylar fracture without intercondylar fracture of right humerus, subsequent encounter for fracture with routine healing: Secondary | ICD-10-CM | POA: Diagnosis not present

## 2015-05-31 DIAGNOSIS — S42414D Nondisplaced simple supracondylar fracture without intercondylar fracture of right humerus, subsequent encounter for fracture with routine healing: Secondary | ICD-10-CM | POA: Diagnosis not present

## 2015-05-31 DIAGNOSIS — R269 Unspecified abnormalities of gait and mobility: Secondary | ICD-10-CM | POA: Diagnosis not present

## 2015-05-31 DIAGNOSIS — W19XXXD Unspecified fall, subsequent encounter: Secondary | ICD-10-CM | POA: Diagnosis not present

## 2015-05-31 DIAGNOSIS — Z87891 Personal history of nicotine dependence: Secondary | ICD-10-CM | POA: Diagnosis not present

## 2015-06-03 DIAGNOSIS — Z87891 Personal history of nicotine dependence: Secondary | ICD-10-CM | POA: Diagnosis not present

## 2015-06-03 DIAGNOSIS — S42414D Nondisplaced simple supracondylar fracture without intercondylar fracture of right humerus, subsequent encounter for fracture with routine healing: Secondary | ICD-10-CM | POA: Diagnosis not present

## 2015-06-03 DIAGNOSIS — W19XXXD Unspecified fall, subsequent encounter: Secondary | ICD-10-CM | POA: Diagnosis not present

## 2015-06-03 DIAGNOSIS — R269 Unspecified abnormalities of gait and mobility: Secondary | ICD-10-CM | POA: Diagnosis not present

## 2015-06-04 DIAGNOSIS — S42414D Nondisplaced simple supracondylar fracture without intercondylar fracture of right humerus, subsequent encounter for fracture with routine healing: Secondary | ICD-10-CM | POA: Diagnosis not present

## 2015-06-04 DIAGNOSIS — Z87891 Personal history of nicotine dependence: Secondary | ICD-10-CM | POA: Diagnosis not present

## 2015-06-04 DIAGNOSIS — W19XXXD Unspecified fall, subsequent encounter: Secondary | ICD-10-CM | POA: Diagnosis not present

## 2015-06-04 DIAGNOSIS — R269 Unspecified abnormalities of gait and mobility: Secondary | ICD-10-CM | POA: Diagnosis not present

## 2015-06-05 DIAGNOSIS — W19XXXD Unspecified fall, subsequent encounter: Secondary | ICD-10-CM | POA: Diagnosis not present

## 2015-06-05 DIAGNOSIS — S42414D Nondisplaced simple supracondylar fracture without intercondylar fracture of right humerus, subsequent encounter for fracture with routine healing: Secondary | ICD-10-CM | POA: Diagnosis not present

## 2015-06-05 DIAGNOSIS — R269 Unspecified abnormalities of gait and mobility: Secondary | ICD-10-CM | POA: Diagnosis not present

## 2015-06-05 DIAGNOSIS — Z87891 Personal history of nicotine dependence: Secondary | ICD-10-CM | POA: Diagnosis not present

## 2015-06-06 DIAGNOSIS — Z87891 Personal history of nicotine dependence: Secondary | ICD-10-CM | POA: Diagnosis not present

## 2015-06-06 DIAGNOSIS — I10 Essential (primary) hypertension: Secondary | ICD-10-CM | POA: Diagnosis not present

## 2015-06-06 DIAGNOSIS — R269 Unspecified abnormalities of gait and mobility: Secondary | ICD-10-CM | POA: Diagnosis not present

## 2015-06-06 DIAGNOSIS — E785 Hyperlipidemia, unspecified: Secondary | ICD-10-CM | POA: Diagnosis not present

## 2015-06-06 DIAGNOSIS — W19XXXD Unspecified fall, subsequent encounter: Secondary | ICD-10-CM | POA: Diagnosis not present

## 2015-06-06 DIAGNOSIS — S42414D Nondisplaced simple supracondylar fracture without intercondylar fracture of right humerus, subsequent encounter for fracture with routine healing: Secondary | ICD-10-CM | POA: Diagnosis not present

## 2015-06-06 DIAGNOSIS — Z125 Encounter for screening for malignant neoplasm of prostate: Secondary | ICD-10-CM | POA: Diagnosis not present

## 2015-06-10 DIAGNOSIS — R269 Unspecified abnormalities of gait and mobility: Secondary | ICD-10-CM | POA: Diagnosis not present

## 2015-06-10 DIAGNOSIS — Z87891 Personal history of nicotine dependence: Secondary | ICD-10-CM | POA: Diagnosis not present

## 2015-06-10 DIAGNOSIS — W19XXXD Unspecified fall, subsequent encounter: Secondary | ICD-10-CM | POA: Diagnosis not present

## 2015-06-10 DIAGNOSIS — S42414D Nondisplaced simple supracondylar fracture without intercondylar fracture of right humerus, subsequent encounter for fracture with routine healing: Secondary | ICD-10-CM | POA: Diagnosis not present

## 2015-06-12 DIAGNOSIS — S42414D Nondisplaced simple supracondylar fracture without intercondylar fracture of right humerus, subsequent encounter for fracture with routine healing: Secondary | ICD-10-CM | POA: Diagnosis not present

## 2015-06-12 DIAGNOSIS — Z87891 Personal history of nicotine dependence: Secondary | ICD-10-CM | POA: Diagnosis not present

## 2015-06-12 DIAGNOSIS — R269 Unspecified abnormalities of gait and mobility: Secondary | ICD-10-CM | POA: Diagnosis not present

## 2015-06-12 DIAGNOSIS — W19XXXD Unspecified fall, subsequent encounter: Secondary | ICD-10-CM | POA: Diagnosis not present

## 2015-06-19 DIAGNOSIS — W19XXXD Unspecified fall, subsequent encounter: Secondary | ICD-10-CM | POA: Diagnosis not present

## 2015-06-19 DIAGNOSIS — Z87891 Personal history of nicotine dependence: Secondary | ICD-10-CM | POA: Diagnosis not present

## 2015-06-19 DIAGNOSIS — S42414D Nondisplaced simple supracondylar fracture without intercondylar fracture of right humerus, subsequent encounter for fracture with routine healing: Secondary | ICD-10-CM | POA: Diagnosis not present

## 2015-06-19 DIAGNOSIS — R269 Unspecified abnormalities of gait and mobility: Secondary | ICD-10-CM | POA: Diagnosis not present

## 2015-06-20 DIAGNOSIS — S42411D Displaced simple supracondylar fracture without intercondylar fracture of right humerus, subsequent encounter for fracture with routine healing: Secondary | ICD-10-CM | POA: Diagnosis not present

## 2015-06-21 DIAGNOSIS — Z87891 Personal history of nicotine dependence: Secondary | ICD-10-CM | POA: Diagnosis not present

## 2015-06-21 DIAGNOSIS — R269 Unspecified abnormalities of gait and mobility: Secondary | ICD-10-CM | POA: Diagnosis not present

## 2015-06-21 DIAGNOSIS — S42414D Nondisplaced simple supracondylar fracture without intercondylar fracture of right humerus, subsequent encounter for fracture with routine healing: Secondary | ICD-10-CM | POA: Diagnosis not present

## 2015-06-21 DIAGNOSIS — W19XXXD Unspecified fall, subsequent encounter: Secondary | ICD-10-CM | POA: Diagnosis not present

## 2015-06-26 DIAGNOSIS — W19XXXD Unspecified fall, subsequent encounter: Secondary | ICD-10-CM | POA: Diagnosis not present

## 2015-06-26 DIAGNOSIS — Z87891 Personal history of nicotine dependence: Secondary | ICD-10-CM | POA: Diagnosis not present

## 2015-06-26 DIAGNOSIS — R269 Unspecified abnormalities of gait and mobility: Secondary | ICD-10-CM | POA: Diagnosis not present

## 2015-06-26 DIAGNOSIS — S42414D Nondisplaced simple supracondylar fracture without intercondylar fracture of right humerus, subsequent encounter for fracture with routine healing: Secondary | ICD-10-CM | POA: Diagnosis not present

## 2015-06-27 DIAGNOSIS — R269 Unspecified abnormalities of gait and mobility: Secondary | ICD-10-CM | POA: Diagnosis not present

## 2015-06-27 DIAGNOSIS — W19XXXD Unspecified fall, subsequent encounter: Secondary | ICD-10-CM | POA: Diagnosis not present

## 2015-06-27 DIAGNOSIS — Z87891 Personal history of nicotine dependence: Secondary | ICD-10-CM | POA: Diagnosis not present

## 2015-06-27 DIAGNOSIS — S42414D Nondisplaced simple supracondylar fracture without intercondylar fracture of right humerus, subsequent encounter for fracture with routine healing: Secondary | ICD-10-CM | POA: Diagnosis not present

## 2015-07-02 DIAGNOSIS — R972 Elevated prostate specific antigen [PSA]: Secondary | ICD-10-CM | POA: Diagnosis not present

## 2015-07-02 DIAGNOSIS — Z6827 Body mass index (BMI) 27.0-27.9, adult: Secondary | ICD-10-CM | POA: Diagnosis not present

## 2015-07-02 DIAGNOSIS — G20 Parkinson's disease: Secondary | ICD-10-CM | POA: Diagnosis not present

## 2015-07-02 DIAGNOSIS — E785 Hyperlipidemia, unspecified: Secondary | ICD-10-CM | POA: Diagnosis not present

## 2015-07-02 DIAGNOSIS — N183 Chronic kidney disease, stage 3 (moderate): Secondary | ICD-10-CM | POA: Diagnosis not present

## 2015-07-02 DIAGNOSIS — I10 Essential (primary) hypertension: Secondary | ICD-10-CM | POA: Diagnosis not present

## 2015-07-02 DIAGNOSIS — Z0000 Encounter for general adult medical examination without abnormal findings: Secondary | ICD-10-CM | POA: Diagnosis not present

## 2015-07-02 DIAGNOSIS — Z1389 Encounter for screening for other disorder: Secondary | ICD-10-CM | POA: Diagnosis not present

## 2015-07-02 DIAGNOSIS — H6123 Impacted cerumen, bilateral: Secondary | ICD-10-CM | POA: Diagnosis not present

## 2015-07-03 DIAGNOSIS — W19XXXD Unspecified fall, subsequent encounter: Secondary | ICD-10-CM | POA: Diagnosis not present

## 2015-07-03 DIAGNOSIS — R269 Unspecified abnormalities of gait and mobility: Secondary | ICD-10-CM | POA: Diagnosis not present

## 2015-07-03 DIAGNOSIS — Z87891 Personal history of nicotine dependence: Secondary | ICD-10-CM | POA: Diagnosis not present

## 2015-07-03 DIAGNOSIS — S42414D Nondisplaced simple supracondylar fracture without intercondylar fracture of right humerus, subsequent encounter for fracture with routine healing: Secondary | ICD-10-CM | POA: Diagnosis not present

## 2015-07-05 DIAGNOSIS — R269 Unspecified abnormalities of gait and mobility: Secondary | ICD-10-CM | POA: Diagnosis not present

## 2015-07-05 DIAGNOSIS — S42414D Nondisplaced simple supracondylar fracture without intercondylar fracture of right humerus, subsequent encounter for fracture with routine healing: Secondary | ICD-10-CM | POA: Diagnosis not present

## 2015-07-05 DIAGNOSIS — Z87891 Personal history of nicotine dependence: Secondary | ICD-10-CM | POA: Diagnosis not present

## 2015-07-05 DIAGNOSIS — W19XXXD Unspecified fall, subsequent encounter: Secondary | ICD-10-CM | POA: Diagnosis not present

## 2015-07-10 DIAGNOSIS — S42414D Nondisplaced simple supracondylar fracture without intercondylar fracture of right humerus, subsequent encounter for fracture with routine healing: Secondary | ICD-10-CM | POA: Diagnosis not present

## 2015-07-10 DIAGNOSIS — M6281 Muscle weakness (generalized): Secondary | ICD-10-CM | POA: Diagnosis not present

## 2015-07-10 DIAGNOSIS — R251 Tremor, unspecified: Secondary | ICD-10-CM | POA: Diagnosis not present

## 2015-07-10 DIAGNOSIS — R269 Unspecified abnormalities of gait and mobility: Secondary | ICD-10-CM | POA: Diagnosis not present

## 2015-07-10 DIAGNOSIS — Z87891 Personal history of nicotine dependence: Secondary | ICD-10-CM | POA: Diagnosis not present

## 2015-07-10 DIAGNOSIS — W19XXXD Unspecified fall, subsequent encounter: Secondary | ICD-10-CM | POA: Diagnosis not present

## 2015-07-11 DIAGNOSIS — R269 Unspecified abnormalities of gait and mobility: Secondary | ICD-10-CM | POA: Diagnosis not present

## 2015-07-11 DIAGNOSIS — W19XXXD Unspecified fall, subsequent encounter: Secondary | ICD-10-CM | POA: Diagnosis not present

## 2015-07-11 DIAGNOSIS — S42414D Nondisplaced simple supracondylar fracture without intercondylar fracture of right humerus, subsequent encounter for fracture with routine healing: Secondary | ICD-10-CM | POA: Diagnosis not present

## 2015-07-11 DIAGNOSIS — Z87891 Personal history of nicotine dependence: Secondary | ICD-10-CM | POA: Diagnosis not present

## 2015-07-11 DIAGNOSIS — M6281 Muscle weakness (generalized): Secondary | ICD-10-CM | POA: Diagnosis not present

## 2015-07-11 DIAGNOSIS — R251 Tremor, unspecified: Secondary | ICD-10-CM | POA: Diagnosis not present

## 2015-07-16 DIAGNOSIS — R251 Tremor, unspecified: Secondary | ICD-10-CM | POA: Diagnosis not present

## 2015-07-16 DIAGNOSIS — S42414D Nondisplaced simple supracondylar fracture without intercondylar fracture of right humerus, subsequent encounter for fracture with routine healing: Secondary | ICD-10-CM | POA: Diagnosis not present

## 2015-07-16 DIAGNOSIS — W19XXXD Unspecified fall, subsequent encounter: Secondary | ICD-10-CM | POA: Diagnosis not present

## 2015-07-16 DIAGNOSIS — Z87891 Personal history of nicotine dependence: Secondary | ICD-10-CM | POA: Diagnosis not present

## 2015-07-16 DIAGNOSIS — R269 Unspecified abnormalities of gait and mobility: Secondary | ICD-10-CM | POA: Diagnosis not present

## 2015-07-16 DIAGNOSIS — M6281 Muscle weakness (generalized): Secondary | ICD-10-CM | POA: Diagnosis not present

## 2015-07-18 DIAGNOSIS — R269 Unspecified abnormalities of gait and mobility: Secondary | ICD-10-CM | POA: Diagnosis not present

## 2015-07-18 DIAGNOSIS — M6281 Muscle weakness (generalized): Secondary | ICD-10-CM | POA: Diagnosis not present

## 2015-07-18 DIAGNOSIS — W19XXXD Unspecified fall, subsequent encounter: Secondary | ICD-10-CM | POA: Diagnosis not present

## 2015-07-18 DIAGNOSIS — S42414D Nondisplaced simple supracondylar fracture without intercondylar fracture of right humerus, subsequent encounter for fracture with routine healing: Secondary | ICD-10-CM | POA: Diagnosis not present

## 2015-07-18 DIAGNOSIS — R251 Tremor, unspecified: Secondary | ICD-10-CM | POA: Diagnosis not present

## 2015-07-18 DIAGNOSIS — Z87891 Personal history of nicotine dependence: Secondary | ICD-10-CM | POA: Diagnosis not present

## 2015-07-22 DIAGNOSIS — R251 Tremor, unspecified: Secondary | ICD-10-CM | POA: Diagnosis not present

## 2015-07-22 DIAGNOSIS — S42414D Nondisplaced simple supracondylar fracture without intercondylar fracture of right humerus, subsequent encounter for fracture with routine healing: Secondary | ICD-10-CM | POA: Diagnosis not present

## 2015-07-22 DIAGNOSIS — Z87891 Personal history of nicotine dependence: Secondary | ICD-10-CM | POA: Diagnosis not present

## 2015-07-22 DIAGNOSIS — W19XXXD Unspecified fall, subsequent encounter: Secondary | ICD-10-CM | POA: Diagnosis not present

## 2015-07-22 DIAGNOSIS — R269 Unspecified abnormalities of gait and mobility: Secondary | ICD-10-CM | POA: Diagnosis not present

## 2015-07-22 DIAGNOSIS — M6281 Muscle weakness (generalized): Secondary | ICD-10-CM | POA: Diagnosis not present

## 2015-07-25 DIAGNOSIS — R269 Unspecified abnormalities of gait and mobility: Secondary | ICD-10-CM | POA: Diagnosis not present

## 2015-07-25 DIAGNOSIS — W19XXXD Unspecified fall, subsequent encounter: Secondary | ICD-10-CM | POA: Diagnosis not present

## 2015-07-25 DIAGNOSIS — S42414D Nondisplaced simple supracondylar fracture without intercondylar fracture of right humerus, subsequent encounter for fracture with routine healing: Secondary | ICD-10-CM | POA: Diagnosis not present

## 2015-07-25 DIAGNOSIS — M6281 Muscle weakness (generalized): Secondary | ICD-10-CM | POA: Diagnosis not present

## 2015-07-25 DIAGNOSIS — Z87891 Personal history of nicotine dependence: Secondary | ICD-10-CM | POA: Diagnosis not present

## 2015-07-25 DIAGNOSIS — R251 Tremor, unspecified: Secondary | ICD-10-CM | POA: Diagnosis not present

## 2015-07-29 DIAGNOSIS — R269 Unspecified abnormalities of gait and mobility: Secondary | ICD-10-CM | POA: Diagnosis not present

## 2015-07-29 DIAGNOSIS — S42414D Nondisplaced simple supracondylar fracture without intercondylar fracture of right humerus, subsequent encounter for fracture with routine healing: Secondary | ICD-10-CM | POA: Diagnosis not present

## 2015-07-29 DIAGNOSIS — R251 Tremor, unspecified: Secondary | ICD-10-CM | POA: Diagnosis not present

## 2015-07-29 DIAGNOSIS — W19XXXD Unspecified fall, subsequent encounter: Secondary | ICD-10-CM | POA: Diagnosis not present

## 2015-07-29 DIAGNOSIS — M6281 Muscle weakness (generalized): Secondary | ICD-10-CM | POA: Diagnosis not present

## 2015-07-29 DIAGNOSIS — Z87891 Personal history of nicotine dependence: Secondary | ICD-10-CM | POA: Diagnosis not present

## 2015-07-31 DIAGNOSIS — W19XXXD Unspecified fall, subsequent encounter: Secondary | ICD-10-CM | POA: Diagnosis not present

## 2015-07-31 DIAGNOSIS — S42414D Nondisplaced simple supracondylar fracture without intercondylar fracture of right humerus, subsequent encounter for fracture with routine healing: Secondary | ICD-10-CM | POA: Diagnosis not present

## 2015-07-31 DIAGNOSIS — Z87891 Personal history of nicotine dependence: Secondary | ICD-10-CM | POA: Diagnosis not present

## 2015-07-31 DIAGNOSIS — R251 Tremor, unspecified: Secondary | ICD-10-CM | POA: Diagnosis not present

## 2015-07-31 DIAGNOSIS — R269 Unspecified abnormalities of gait and mobility: Secondary | ICD-10-CM | POA: Diagnosis not present

## 2015-07-31 DIAGNOSIS — M6281 Muscle weakness (generalized): Secondary | ICD-10-CM | POA: Diagnosis not present

## 2015-08-05 DIAGNOSIS — M6281 Muscle weakness (generalized): Secondary | ICD-10-CM | POA: Diagnosis not present

## 2015-08-05 DIAGNOSIS — Z87891 Personal history of nicotine dependence: Secondary | ICD-10-CM | POA: Diagnosis not present

## 2015-08-05 DIAGNOSIS — W19XXXD Unspecified fall, subsequent encounter: Secondary | ICD-10-CM | POA: Diagnosis not present

## 2015-08-05 DIAGNOSIS — R251 Tremor, unspecified: Secondary | ICD-10-CM | POA: Diagnosis not present

## 2015-08-05 DIAGNOSIS — R269 Unspecified abnormalities of gait and mobility: Secondary | ICD-10-CM | POA: Diagnosis not present

## 2015-08-05 DIAGNOSIS — S42414D Nondisplaced simple supracondylar fracture without intercondylar fracture of right humerus, subsequent encounter for fracture with routine healing: Secondary | ICD-10-CM | POA: Diagnosis not present

## 2015-08-09 DIAGNOSIS — S42414D Nondisplaced simple supracondylar fracture without intercondylar fracture of right humerus, subsequent encounter for fracture with routine healing: Secondary | ICD-10-CM | POA: Diagnosis not present

## 2015-08-09 DIAGNOSIS — R251 Tremor, unspecified: Secondary | ICD-10-CM | POA: Diagnosis not present

## 2015-08-09 DIAGNOSIS — R269 Unspecified abnormalities of gait and mobility: Secondary | ICD-10-CM | POA: Diagnosis not present

## 2015-08-09 DIAGNOSIS — W19XXXD Unspecified fall, subsequent encounter: Secondary | ICD-10-CM | POA: Diagnosis not present

## 2015-08-09 DIAGNOSIS — Z87891 Personal history of nicotine dependence: Secondary | ICD-10-CM | POA: Diagnosis not present

## 2015-08-09 DIAGNOSIS — M6281 Muscle weakness (generalized): Secondary | ICD-10-CM | POA: Diagnosis not present

## 2015-08-12 DIAGNOSIS — W19XXXD Unspecified fall, subsequent encounter: Secondary | ICD-10-CM | POA: Diagnosis not present

## 2015-08-12 DIAGNOSIS — Z87891 Personal history of nicotine dependence: Secondary | ICD-10-CM | POA: Diagnosis not present

## 2015-08-12 DIAGNOSIS — R269 Unspecified abnormalities of gait and mobility: Secondary | ICD-10-CM | POA: Diagnosis not present

## 2015-08-12 DIAGNOSIS — R251 Tremor, unspecified: Secondary | ICD-10-CM | POA: Diagnosis not present

## 2015-08-12 DIAGNOSIS — M6281 Muscle weakness (generalized): Secondary | ICD-10-CM | POA: Diagnosis not present

## 2015-08-12 DIAGNOSIS — S42414D Nondisplaced simple supracondylar fracture without intercondylar fracture of right humerus, subsequent encounter for fracture with routine healing: Secondary | ICD-10-CM | POA: Diagnosis not present

## 2015-08-13 DIAGNOSIS — S42411D Displaced simple supracondylar fracture without intercondylar fracture of right humerus, subsequent encounter for fracture with routine healing: Secondary | ICD-10-CM | POA: Diagnosis not present

## 2015-08-14 DIAGNOSIS — S42414D Nondisplaced simple supracondylar fracture without intercondylar fracture of right humerus, subsequent encounter for fracture with routine healing: Secondary | ICD-10-CM | POA: Diagnosis not present

## 2015-08-14 DIAGNOSIS — Z87891 Personal history of nicotine dependence: Secondary | ICD-10-CM | POA: Diagnosis not present

## 2015-08-14 DIAGNOSIS — R269 Unspecified abnormalities of gait and mobility: Secondary | ICD-10-CM | POA: Diagnosis not present

## 2015-08-14 DIAGNOSIS — R251 Tremor, unspecified: Secondary | ICD-10-CM | POA: Diagnosis not present

## 2015-08-14 DIAGNOSIS — W19XXXD Unspecified fall, subsequent encounter: Secondary | ICD-10-CM | POA: Diagnosis not present

## 2015-08-14 DIAGNOSIS — M6281 Muscle weakness (generalized): Secondary | ICD-10-CM | POA: Diagnosis not present

## 2015-08-19 DIAGNOSIS — M6281 Muscle weakness (generalized): Secondary | ICD-10-CM | POA: Diagnosis not present

## 2015-08-19 DIAGNOSIS — R251 Tremor, unspecified: Secondary | ICD-10-CM | POA: Diagnosis not present

## 2015-08-19 DIAGNOSIS — S42414D Nondisplaced simple supracondylar fracture without intercondylar fracture of right humerus, subsequent encounter for fracture with routine healing: Secondary | ICD-10-CM | POA: Diagnosis not present

## 2015-08-19 DIAGNOSIS — R269 Unspecified abnormalities of gait and mobility: Secondary | ICD-10-CM | POA: Diagnosis not present

## 2015-08-19 DIAGNOSIS — W19XXXD Unspecified fall, subsequent encounter: Secondary | ICD-10-CM | POA: Diagnosis not present

## 2015-08-19 DIAGNOSIS — Z87891 Personal history of nicotine dependence: Secondary | ICD-10-CM | POA: Diagnosis not present

## 2015-08-22 DIAGNOSIS — S42414D Nondisplaced simple supracondylar fracture without intercondylar fracture of right humerus, subsequent encounter for fracture with routine healing: Secondary | ICD-10-CM | POA: Diagnosis not present

## 2015-08-22 DIAGNOSIS — Z87891 Personal history of nicotine dependence: Secondary | ICD-10-CM | POA: Diagnosis not present

## 2015-08-22 DIAGNOSIS — M6281 Muscle weakness (generalized): Secondary | ICD-10-CM | POA: Diagnosis not present

## 2015-08-22 DIAGNOSIS — R251 Tremor, unspecified: Secondary | ICD-10-CM | POA: Diagnosis not present

## 2015-08-22 DIAGNOSIS — W19XXXD Unspecified fall, subsequent encounter: Secondary | ICD-10-CM | POA: Diagnosis not present

## 2015-08-22 DIAGNOSIS — R269 Unspecified abnormalities of gait and mobility: Secondary | ICD-10-CM | POA: Diagnosis not present

## 2015-08-26 DIAGNOSIS — S42414D Nondisplaced simple supracondylar fracture without intercondylar fracture of right humerus, subsequent encounter for fracture with routine healing: Secondary | ICD-10-CM | POA: Diagnosis not present

## 2015-08-26 DIAGNOSIS — W19XXXD Unspecified fall, subsequent encounter: Secondary | ICD-10-CM | POA: Diagnosis not present

## 2015-08-26 DIAGNOSIS — Z87891 Personal history of nicotine dependence: Secondary | ICD-10-CM | POA: Diagnosis not present

## 2015-08-26 DIAGNOSIS — R269 Unspecified abnormalities of gait and mobility: Secondary | ICD-10-CM | POA: Diagnosis not present

## 2015-08-26 DIAGNOSIS — R251 Tremor, unspecified: Secondary | ICD-10-CM | POA: Diagnosis not present

## 2015-08-26 DIAGNOSIS — M6281 Muscle weakness (generalized): Secondary | ICD-10-CM | POA: Diagnosis not present

## 2015-08-28 DIAGNOSIS — R251 Tremor, unspecified: Secondary | ICD-10-CM | POA: Diagnosis not present

## 2015-08-28 DIAGNOSIS — M6281 Muscle weakness (generalized): Secondary | ICD-10-CM | POA: Diagnosis not present

## 2015-08-28 DIAGNOSIS — Z87891 Personal history of nicotine dependence: Secondary | ICD-10-CM | POA: Diagnosis not present

## 2015-08-28 DIAGNOSIS — S42414D Nondisplaced simple supracondylar fracture without intercondylar fracture of right humerus, subsequent encounter for fracture with routine healing: Secondary | ICD-10-CM | POA: Diagnosis not present

## 2015-08-28 DIAGNOSIS — R269 Unspecified abnormalities of gait and mobility: Secondary | ICD-10-CM | POA: Diagnosis not present

## 2015-08-28 DIAGNOSIS — W19XXXD Unspecified fall, subsequent encounter: Secondary | ICD-10-CM | POA: Diagnosis not present

## 2015-09-03 DIAGNOSIS — R251 Tremor, unspecified: Secondary | ICD-10-CM | POA: Diagnosis not present

## 2015-09-03 DIAGNOSIS — W19XXXD Unspecified fall, subsequent encounter: Secondary | ICD-10-CM | POA: Diagnosis not present

## 2015-09-03 DIAGNOSIS — Z87891 Personal history of nicotine dependence: Secondary | ICD-10-CM | POA: Diagnosis not present

## 2015-09-03 DIAGNOSIS — M6281 Muscle weakness (generalized): Secondary | ICD-10-CM | POA: Diagnosis not present

## 2015-09-03 DIAGNOSIS — R269 Unspecified abnormalities of gait and mobility: Secondary | ICD-10-CM | POA: Diagnosis not present

## 2015-09-03 DIAGNOSIS — S42414D Nondisplaced simple supracondylar fracture without intercondylar fracture of right humerus, subsequent encounter for fracture with routine healing: Secondary | ICD-10-CM | POA: Diagnosis not present

## 2015-09-05 DIAGNOSIS — W19XXXD Unspecified fall, subsequent encounter: Secondary | ICD-10-CM | POA: Diagnosis not present

## 2015-09-05 DIAGNOSIS — R269 Unspecified abnormalities of gait and mobility: Secondary | ICD-10-CM | POA: Diagnosis not present

## 2015-09-05 DIAGNOSIS — R251 Tremor, unspecified: Secondary | ICD-10-CM | POA: Diagnosis not present

## 2015-09-05 DIAGNOSIS — Z87891 Personal history of nicotine dependence: Secondary | ICD-10-CM | POA: Diagnosis not present

## 2015-09-05 DIAGNOSIS — M6281 Muscle weakness (generalized): Secondary | ICD-10-CM | POA: Diagnosis not present

## 2015-09-05 DIAGNOSIS — S42414D Nondisplaced simple supracondylar fracture without intercondylar fracture of right humerus, subsequent encounter for fracture with routine healing: Secondary | ICD-10-CM | POA: Diagnosis not present

## 2015-09-08 DIAGNOSIS — R413 Other amnesia: Secondary | ICD-10-CM | POA: Diagnosis not present

## 2015-09-08 DIAGNOSIS — G20 Parkinson's disease: Secondary | ICD-10-CM | POA: Diagnosis not present

## 2015-09-08 DIAGNOSIS — Z8781 Personal history of (healed) traumatic fracture: Secondary | ICD-10-CM | POA: Diagnosis not present

## 2015-09-08 DIAGNOSIS — Z9181 History of falling: Secondary | ICD-10-CM | POA: Diagnosis not present

## 2015-09-08 DIAGNOSIS — M6281 Muscle weakness (generalized): Secondary | ICD-10-CM | POA: Diagnosis not present

## 2015-09-09 DIAGNOSIS — G20 Parkinson's disease: Secondary | ICD-10-CM | POA: Diagnosis not present

## 2015-09-09 DIAGNOSIS — Z8781 Personal history of (healed) traumatic fracture: Secondary | ICD-10-CM | POA: Diagnosis not present

## 2015-09-09 DIAGNOSIS — R413 Other amnesia: Secondary | ICD-10-CM | POA: Diagnosis not present

## 2015-09-09 DIAGNOSIS — M6281 Muscle weakness (generalized): Secondary | ICD-10-CM | POA: Diagnosis not present

## 2015-09-09 DIAGNOSIS — Z9181 History of falling: Secondary | ICD-10-CM | POA: Diagnosis not present

## 2015-09-12 DIAGNOSIS — M6281 Muscle weakness (generalized): Secondary | ICD-10-CM | POA: Diagnosis not present

## 2015-09-12 DIAGNOSIS — R413 Other amnesia: Secondary | ICD-10-CM | POA: Diagnosis not present

## 2015-09-12 DIAGNOSIS — Z9181 History of falling: Secondary | ICD-10-CM | POA: Diagnosis not present

## 2015-09-12 DIAGNOSIS — G20 Parkinson's disease: Secondary | ICD-10-CM | POA: Diagnosis not present

## 2015-09-12 DIAGNOSIS — Z8781 Personal history of (healed) traumatic fracture: Secondary | ICD-10-CM | POA: Diagnosis not present

## 2015-09-16 DIAGNOSIS — M6281 Muscle weakness (generalized): Secondary | ICD-10-CM | POA: Diagnosis not present

## 2015-09-16 DIAGNOSIS — Z9181 History of falling: Secondary | ICD-10-CM | POA: Diagnosis not present

## 2015-09-16 DIAGNOSIS — R413 Other amnesia: Secondary | ICD-10-CM | POA: Diagnosis not present

## 2015-09-16 DIAGNOSIS — G20 Parkinson's disease: Secondary | ICD-10-CM | POA: Diagnosis not present

## 2015-09-16 DIAGNOSIS — Z8781 Personal history of (healed) traumatic fracture: Secondary | ICD-10-CM | POA: Diagnosis not present

## 2015-09-18 DIAGNOSIS — G20 Parkinson's disease: Secondary | ICD-10-CM | POA: Diagnosis not present

## 2015-09-18 DIAGNOSIS — Z9181 History of falling: Secondary | ICD-10-CM | POA: Diagnosis not present

## 2015-09-18 DIAGNOSIS — R413 Other amnesia: Secondary | ICD-10-CM | POA: Diagnosis not present

## 2015-09-18 DIAGNOSIS — Z8781 Personal history of (healed) traumatic fracture: Secondary | ICD-10-CM | POA: Diagnosis not present

## 2015-09-18 DIAGNOSIS — M6281 Muscle weakness (generalized): Secondary | ICD-10-CM | POA: Diagnosis not present

## 2015-09-23 DIAGNOSIS — M6281 Muscle weakness (generalized): Secondary | ICD-10-CM | POA: Diagnosis not present

## 2015-09-23 DIAGNOSIS — R413 Other amnesia: Secondary | ICD-10-CM | POA: Diagnosis not present

## 2015-09-23 DIAGNOSIS — Z9181 History of falling: Secondary | ICD-10-CM | POA: Diagnosis not present

## 2015-09-23 DIAGNOSIS — G20 Parkinson's disease: Secondary | ICD-10-CM | POA: Diagnosis not present

## 2015-09-23 DIAGNOSIS — Z8781 Personal history of (healed) traumatic fracture: Secondary | ICD-10-CM | POA: Diagnosis not present

## 2015-09-26 DIAGNOSIS — G20 Parkinson's disease: Secondary | ICD-10-CM | POA: Diagnosis not present

## 2015-09-26 DIAGNOSIS — Z8781 Personal history of (healed) traumatic fracture: Secondary | ICD-10-CM | POA: Diagnosis not present

## 2015-09-26 DIAGNOSIS — M6281 Muscle weakness (generalized): Secondary | ICD-10-CM | POA: Diagnosis not present

## 2015-09-26 DIAGNOSIS — Z9181 History of falling: Secondary | ICD-10-CM | POA: Diagnosis not present

## 2015-09-26 DIAGNOSIS — R413 Other amnesia: Secondary | ICD-10-CM | POA: Diagnosis not present

## 2015-09-30 DIAGNOSIS — R413 Other amnesia: Secondary | ICD-10-CM | POA: Diagnosis not present

## 2015-09-30 DIAGNOSIS — G20 Parkinson's disease: Secondary | ICD-10-CM | POA: Diagnosis not present

## 2015-09-30 DIAGNOSIS — Z8781 Personal history of (healed) traumatic fracture: Secondary | ICD-10-CM | POA: Diagnosis not present

## 2015-09-30 DIAGNOSIS — Z9181 History of falling: Secondary | ICD-10-CM | POA: Diagnosis not present

## 2015-09-30 DIAGNOSIS — M6281 Muscle weakness (generalized): Secondary | ICD-10-CM | POA: Diagnosis not present

## 2015-10-03 DIAGNOSIS — G20 Parkinson's disease: Secondary | ICD-10-CM | POA: Diagnosis not present

## 2015-10-03 DIAGNOSIS — Z9181 History of falling: Secondary | ICD-10-CM | POA: Diagnosis not present

## 2015-10-03 DIAGNOSIS — M6281 Muscle weakness (generalized): Secondary | ICD-10-CM | POA: Diagnosis not present

## 2015-10-03 DIAGNOSIS — R413 Other amnesia: Secondary | ICD-10-CM | POA: Diagnosis not present

## 2015-10-03 DIAGNOSIS — Z8781 Personal history of (healed) traumatic fracture: Secondary | ICD-10-CM | POA: Diagnosis not present

## 2015-10-04 DIAGNOSIS — H2513 Age-related nuclear cataract, bilateral: Secondary | ICD-10-CM | POA: Diagnosis not present

## 2015-10-11 DIAGNOSIS — Z23 Encounter for immunization: Secondary | ICD-10-CM | POA: Diagnosis not present

## 2015-10-11 DIAGNOSIS — R972 Elevated prostate specific antigen [PSA]: Secondary | ICD-10-CM | POA: Diagnosis not present

## 2015-10-11 DIAGNOSIS — G20 Parkinson's disease: Secondary | ICD-10-CM | POA: Diagnosis not present

## 2015-10-11 DIAGNOSIS — Z6828 Body mass index (BMI) 28.0-28.9, adult: Secondary | ICD-10-CM | POA: Diagnosis not present

## 2015-10-11 DIAGNOSIS — I10 Essential (primary) hypertension: Secondary | ICD-10-CM | POA: Diagnosis not present

## 2016-01-07 ENCOUNTER — Inpatient Hospital Stay (HOSPITAL_COMMUNITY)
Admission: EM | Admit: 2016-01-07 | Discharge: 2016-01-11 | DRG: 470 | Disposition: A | Discharge status: Skilled Nursing Facility | Payer: Medicare Other | Attending: Internal Medicine | Admitting: Internal Medicine

## 2016-01-07 ENCOUNTER — Emergency Department (HOSPITAL_COMMUNITY): Payer: Medicare Other

## 2016-01-07 ENCOUNTER — Other Ambulatory Visit (HOSPITAL_COMMUNITY): Payer: Self-pay | Admitting: Orthopaedic Surgery

## 2016-01-07 ENCOUNTER — Encounter (HOSPITAL_COMMUNITY): Payer: Self-pay | Admitting: Emergency Medicine

## 2016-01-07 DIAGNOSIS — S72001A Fracture of unspecified part of neck of right femur, initial encounter for closed fracture: Secondary | ICD-10-CM | POA: Diagnosis present

## 2016-01-07 DIAGNOSIS — N400 Enlarged prostate without lower urinary tract symptoms: Secondary | ICD-10-CM | POA: Diagnosis not present

## 2016-01-07 DIAGNOSIS — D62 Acute posthemorrhagic anemia: Secondary | ICD-10-CM | POA: Diagnosis not present

## 2016-01-07 DIAGNOSIS — Z66 Do not resuscitate: Secondary | ICD-10-CM | POA: Diagnosis present

## 2016-01-07 DIAGNOSIS — G20 Parkinson's disease: Secondary | ICD-10-CM | POA: Diagnosis not present

## 2016-01-07 DIAGNOSIS — N179 Acute kidney failure, unspecified: Secondary | ICD-10-CM | POA: Diagnosis present

## 2016-01-07 DIAGNOSIS — S72001D Fracture of unspecified part of neck of right femur, subsequent encounter for closed fracture with routine healing: Secondary | ICD-10-CM | POA: Diagnosis not present

## 2016-01-07 DIAGNOSIS — Y92009 Unspecified place in unspecified non-institutional (private) residence as the place of occurrence of the external cause: Secondary | ICD-10-CM

## 2016-01-07 DIAGNOSIS — Z87891 Personal history of nicotine dependence: Secondary | ICD-10-CM

## 2016-01-07 DIAGNOSIS — Z9889 Other specified postprocedural states: Secondary | ICD-10-CM

## 2016-01-07 DIAGNOSIS — E875 Hyperkalemia: Secondary | ICD-10-CM | POA: Diagnosis present

## 2016-01-07 DIAGNOSIS — M6281 Muscle weakness (generalized): Secondary | ICD-10-CM | POA: Diagnosis not present

## 2016-01-07 DIAGNOSIS — H2620 Unspecified complicated cataract: Secondary | ICD-10-CM | POA: Diagnosis not present

## 2016-01-07 DIAGNOSIS — Z8781 Personal history of (healed) traumatic fracture: Secondary | ICD-10-CM

## 2016-01-07 DIAGNOSIS — N183 Chronic kidney disease, stage 3 unspecified: Secondary | ICD-10-CM | POA: Diagnosis present

## 2016-01-07 DIAGNOSIS — W19XXXA Unspecified fall, initial encounter: Secondary | ICD-10-CM | POA: Diagnosis present

## 2016-01-07 DIAGNOSIS — S72034A Nondisplaced midcervical fracture of right femur, initial encounter for closed fracture: Secondary | ICD-10-CM | POA: Diagnosis not present

## 2016-01-07 DIAGNOSIS — S299XXA Unspecified injury of thorax, initial encounter: Secondary | ICD-10-CM | POA: Diagnosis not present

## 2016-01-07 DIAGNOSIS — Z96641 Presence of right artificial hip joint: Secondary | ICD-10-CM | POA: Diagnosis not present

## 2016-01-07 DIAGNOSIS — M25551 Pain in right hip: Secondary | ICD-10-CM | POA: Diagnosis not present

## 2016-01-07 DIAGNOSIS — Z85810 Personal history of malignant neoplasm of tongue: Secondary | ICD-10-CM | POA: Diagnosis not present

## 2016-01-07 DIAGNOSIS — T148 Other injury of unspecified body region: Secondary | ICD-10-CM | POA: Diagnosis not present

## 2016-01-07 DIAGNOSIS — R489 Unspecified symbolic dysfunctions: Secondary | ICD-10-CM | POA: Diagnosis not present

## 2016-01-07 DIAGNOSIS — S72091A Other fracture of head and neck of right femur, initial encounter for closed fracture: Secondary | ICD-10-CM | POA: Diagnosis not present

## 2016-01-07 DIAGNOSIS — M25559 Pain in unspecified hip: Secondary | ICD-10-CM | POA: Diagnosis not present

## 2016-01-07 DIAGNOSIS — R262 Difficulty in walking, not elsewhere classified: Secondary | ICD-10-CM | POA: Diagnosis not present

## 2016-01-07 DIAGNOSIS — Z471 Aftercare following joint replacement surgery: Secondary | ICD-10-CM | POA: Diagnosis not present

## 2016-01-07 HISTORY — DX: Parkinson's disease: G20

## 2016-01-07 HISTORY — DX: Parkinson's disease without dyskinesia, without mention of fluctuations: G20.A1

## 2016-01-07 HISTORY — DX: Chronic kidney disease, stage 3 (moderate): N18.3

## 2016-01-07 HISTORY — DX: Chronic kidney disease, stage 3 unspecified: N18.30

## 2016-01-07 LAB — CBG MONITORING, ED: Glucose-Capillary: 121 mg/dL — ABNORMAL HIGH (ref 65–99)

## 2016-01-07 LAB — CBC WITH DIFFERENTIAL/PLATELET
BASOS ABS: 0 10*3/uL (ref 0.0–0.1)
Basophils Relative: 0 %
Eosinophils Absolute: 0 10*3/uL (ref 0.0–0.7)
Eosinophils Relative: 0 %
HEMATOCRIT: 43.2 % (ref 39.0–52.0)
Hemoglobin: 14.7 g/dL (ref 13.0–17.0)
LYMPHS PCT: 7 %
Lymphs Abs: 0.7 10*3/uL (ref 0.7–4.0)
MCH: 33 pg (ref 26.0–34.0)
MCHC: 34 g/dL (ref 30.0–36.0)
MCV: 97.1 fL (ref 78.0–100.0)
MONO ABS: 0.5 10*3/uL (ref 0.1–1.0)
Monocytes Relative: 5 %
NEUTROS ABS: 7.7 10*3/uL (ref 1.7–7.7)
Neutrophils Relative %: 88 %
Platelets: 159 10*3/uL (ref 150–400)
RBC: 4.45 MIL/uL (ref 4.22–5.81)
RDW: 12.3 % (ref 11.5–15.5)
WBC: 8.9 10*3/uL (ref 4.0–10.5)

## 2016-01-07 LAB — BASIC METABOLIC PANEL
ANION GAP: 9 (ref 5–15)
BUN: 27 mg/dL — ABNORMAL HIGH (ref 6–20)
CALCIUM: 9.4 mg/dL (ref 8.9–10.3)
CO2: 24 mmol/L (ref 22–32)
CREATININE: 1.33 mg/dL — AB (ref 0.61–1.24)
Chloride: 104 mmol/L (ref 101–111)
GFR calc Af Amer: 51 mL/min — ABNORMAL LOW (ref >60)
GFR calc non Af Amer: 44 mL/min — ABNORMAL LOW (ref >60)
GLUCOSE: 137 mg/dL — AB (ref 65–99)
Potassium: 5.5 mmol/L — ABNORMAL HIGH (ref 3.5–5.1)
Sodium: 137 mmol/L (ref 135–145)

## 2016-01-07 LAB — APTT: APTT: 29 s (ref 24–37)

## 2016-01-07 LAB — TYPE AND SCREEN
ABO/RH(D): O POS
ANTIBODY SCREEN: NEGATIVE

## 2016-01-07 LAB — PROTIME-INR
INR: 1.09 (ref 0.00–1.49)
Prothrombin Time: 14.3 seconds (ref 11.6–15.2)

## 2016-01-07 MED ORDER — ZOLPIDEM TARTRATE 5 MG PO TABS
5.0000 mg | ORAL_TABLET | Freq: Once | ORAL | Status: AC
  Administered 2016-01-07: 5 mg via ORAL
  Filled 2016-01-07: qty 1

## 2016-01-07 MED ORDER — MORPHINE SULFATE (PF) 2 MG/ML IV SOLN
2.0000 mg | INTRAVENOUS | Status: DC | PRN
  Administered 2016-01-07 – 2016-01-08 (×3): 2 mg via INTRAVENOUS
  Filled 2016-01-07 (×3): qty 1

## 2016-01-07 MED ORDER — ADULT MULTIVITAMIN W/MINERALS CH
1.0000 | ORAL_TABLET | Freq: Every day | ORAL | Status: DC
  Administered 2016-01-07 – 2016-01-11 (×4): 1 via ORAL
  Filled 2016-01-07 (×4): qty 1

## 2016-01-07 MED ORDER — HYDRALAZINE HCL 20 MG/ML IJ SOLN: 5.0000 mg | INTRAMUSCULAR | Status: DC | PRN

## 2016-01-07 MED ORDER — HYDROCODONE-ACETAMINOPHEN 5-325 MG PO TABS
1.0000 | ORAL_TABLET | Freq: Once | ORAL | Status: AC
  Administered 2016-01-07: 1 via ORAL
  Filled 2016-01-07: qty 1

## 2016-01-07 MED ORDER — ACETAMINOPHEN 325 MG PO TABS: 650.0000 mg | ORAL_TABLET | Freq: Four times a day (QID) | ORAL | Status: DC | PRN

## 2016-01-07 MED ORDER — CYCLOBENZAPRINE HCL 5 MG PO TABS
7.5000 mg | ORAL_TABLET | Freq: Once | ORAL | Status: AC
Start: 2016-01-07 — End: 2016-01-07
  Administered 2016-01-07: 7.5 mg via ORAL
  Filled 2016-01-07: qty 2

## 2016-01-07 MED ORDER — SODIUM CHLORIDE 0.9 % IV SOLN
INTRAVENOUS | Status: DC
  Administered 2016-01-07: 21:00:00 via INTRAVENOUS

## 2016-01-07 MED ORDER — INSULIN ASPART 100 UNIT/ML ~~LOC~~ SOLN
5.0000 [IU] | Freq: Once | SUBCUTANEOUS | Status: DC
  Filled 2016-01-07: qty 1

## 2016-01-07 MED ORDER — DEXTROSE 50 % IV SOLN
25.0000 mL | INTRAVENOUS | Status: DC | PRN
Start: 2016-01-07 — End: 2016-01-11

## 2016-01-07 MED ORDER — SODIUM POLYSTYRENE SULFONATE 15 GM/60ML PO SUSP
30.0000 g | Freq: Once | ORAL | Status: AC
  Administered 2016-01-07: 30 g via ORAL
  Filled 2016-01-07: qty 120

## 2016-01-07 MED ORDER — CALCIUM CARBONATE ANTACID 500 MG PO CHEW
1.0000 | CHEWABLE_TABLET | Freq: Every day | ORAL | Status: DC | PRN
  Filled 2016-01-07: qty 1

## 2016-01-07 MED ORDER — ROPINIROLE HCL 1 MG PO TABS
1.0000 mg | ORAL_TABLET | Freq: Every day | ORAL | Status: DC
  Administered 2016-01-07 – 2016-01-10 (×3): 1 mg via ORAL
  Filled 2016-01-07 (×5): qty 1

## 2016-01-07 MED ORDER — CALCIUM CHLORIDE 10 % IV SOLN
1.0000 g | Freq: Once | INTRAVENOUS | Status: AC
  Administered 2016-01-07: 1 g via INTRAVENOUS
  Filled 2016-01-07: qty 10

## 2016-01-07 MED ORDER — SODIUM CHLORIDE 0.9 % IV BOLUS (SEPSIS)
1000.0000 mL | Freq: Once | INTRAVENOUS | Status: AC
  Administered 2016-01-07: 1000 mL via INTRAVENOUS

## 2016-01-07 MED ORDER — SODIUM CHLORIDE 0.9 % IJ SOLN
3.0000 mL | Freq: Two times a day (BID) | INTRAMUSCULAR | Status: DC
  Administered 2016-01-07 – 2016-01-10 (×3): 3 mL via INTRAVENOUS

## 2016-01-07 MED ORDER — ACETAMINOPHEN 650 MG RE SUPP: 650.0000 mg | Freq: Four times a day (QID) | RECTAL | Status: DC | PRN

## 2016-01-07 MED ORDER — HYDROCODONE-ACETAMINOPHEN 5-325 MG PO TABS: 1.0000 | ORAL_TABLET | ORAL | Status: DC | PRN

## 2016-01-07 NOTE — Progress Notes (Addendum)
Patient presents to Ed post fall sustaining fracture to right femoral neck.  EDCM spoke to patient and his daughter at bedside.  Patient lives at home with his wife.  Patient does not have home health services but does have services with [facility] "around the clock"  Per patient's daughter.  Patient's daughter reports patient has a wheelchair with lift for stairs outside, cane, walker and bedside commode.  Patient reports he usually ambulates with a cane.  Patient confrims his pcp is Dr. Bevelyn Buckles, system updated.  No further EDCM needs at this time.  Patient's daughter would like to speak to social worker regarding rehab placement. [facility] informed EDSW.

## 2016-01-07 NOTE — Consult Note (Signed)
Patient ID: Nicholas Andrews MRN: [id number] DOB/AGE: 1918/05/09 82 y.o.  Admit date: 01/07/2016  Admission Diagnoses:  Active Problems:   * No active hospital problems. *   HPI: Patient fell earlier today while at home doing exercises. Fell backward landing on right hip.  Unable to weightbear. xrays showed a impacted right femoral neck fracture. No other injuries.    Past Medical History: Past Medical History  Diagnosis Date  . BPH (benign prostatic hyperplasia)   . H/O tongue cancer   . Cataract     Surgical History: Past Surgical History  Procedure Laterality Date  . Tonsillectomy and adenoidectomy    . Transurethral resection of prostate    . Appendectomy    . Eye surgery      Family History: No family history on file.  Social History: Social History   Social History  . Marital Status: Married    Spouse Name: N/A  . Number of Children: N/A  . Years of Education: N/A   Occupational History  . Not on file.   Social History Main Topics  . Smoking status: Former Research scientist (life sciences)  . Smokeless tobacco: Not on file  . Alcohol Use: No  . Drug Use: No  . Sexual Activity: No   Other Topics Concern  . Not on file   Social History Narrative    Allergies: Review of patient's allergies indicates no known allergies.  Medications: I have reviewed the patient's current medications.  Vital Signs: Patient Vitals for the past 24 hrs:  BP Temp Temp src Pulse Resp SpO2  01/07/16 1710 140/83 mmHg 98 F (36.7 C) Oral 73 16 94 %    Radiology: Dg Chest 1 View  01/07/2016  CLINICAL DATA:  Fall today with right hip fracture. EXAM: CHEST 1 VIEW COMPARISON:  None. FINDINGS: Lung volumes are low leading to crowding of bronchovascular structures. Pulmonary vasculature is difficult to assess due to low lung volumes and portable technique. Mild peribronchial thickening. There is elevation of the left hemidiaphragm. The heart is normal in size. No confluent airspace disease. Minimal  blunting of left costophrenic angle, suspect this is chronic. No pneumothorax. The bones are under mineralized. No acute osseous abnormalities are seen. IMPRESSION: Hypoventilatory chest with crowding of bronchovascular structures. Mild peribronchial thickening. Elevation of the left hemidiaphragm. Blunting of the left costophrenic angle, suspect this is chronic. Electronically Signed   By: [name]   On: 01/07/2016 18:09   Dg Hip Unilat With Pelvis 2-3 Views Right  01/07/2016  CLINICAL DATA:  Right hip pain and ankle pain.  Status post fall. EXAM: DG HIP (WITH OR WITHOUT PELVIS) 2-3V RIGHT COMPARISON:  None. FINDINGS: There is generalized osteopenia. There is a mildly angulated and displaced right femoral neck fracture. There is no other fracture or dislocation. There are degenerative changes of the lower lumbar spine. IMPRESSION: Mildly angulated and displaced right femoral neck fracture. Electronically Signed   By: [name]   On: 01/07/2016 16:03   Dg Femur, Min 2 Views Right  01/07/2016  CLINICAL DATA:  Fall with right hip pain.  Initial encounter. EXAM: RIGHT FEMUR 2 VIEWS COMPARISON:  None. FINDINGS: Acute right femoral neck fracture with medial impaction. The fracture is trans to basi cervical. No evidence of intertrochanteric extension. The remainder of the femur is intact. Osteopenia. IMPRESSION: Impacted right femoral neck fracture. Electronically Signed   By: [name]   On: 01/07/2016 16:02    Labs:  Recent Labs  01/07/16  1655  WBC 8.9  RBC 4.45  HCT 43.2  PLT 159    Recent Labs  01/07/16 1655  NA 137  K 5.5*  CL 104  CO2 24  BUN 27*  CREATININE 1.33*  GLUCOSE 137*  CALCIUM 9.4    Recent Labs  01/07/16 1655  INR 1.09    Review of Systems: Review of Systems  Constitutional: Negative.   HENT: Negative.   Respiratory: Negative.   Cardiovascular: Negative.   Musculoskeletal: Positive for joint pain and falls.  Psychiatric/Behavioral:  Negative.     Physical Exam: Neurologically intact Sensation intact distally Intact pulses distally Dorsiflexion/Plantar flexion intact Compartment soft Alert and oriented Right LE- positive log roll.   Assessment and Plan: Right femoral neck fracture. Discussed with patient and his daughter who is present that best treatment option is right hip hemiarthroplasty.  Surgical procedure discussed.  Medicine service will admit.  NPO after midnight. Plan for OR tomorrow afternoon if medically stable. Bed rest.  Strict NWB.  Will have ortho tech apply bucks traction.  All questions answered.

## 2016-01-07 NOTE — ED Notes (Signed)
Ortho at bedside.

## 2016-01-07 NOTE — H&P (Addendum)
[facility] History and Physical  Nicholas Andrews [id number] DOB: June 17, 1918 DOA: 01/07/2016  Referring physician: ED physician PCP: [name], MD  Specialists:   Chief Complaint: right hip pain after fall  HPI: Nicholas Andrews is a 82 y.o. male with PMH of chronic kidney disease-III, Parkinson's syndrome, cataract, history of tongue cancer, BPH, poor hearing, who presents with right hip pain after fall.  Pt reports that patient was doing physical therapy exercises with a band and lost his grip on the band. He fell back, landing on his right leg/hip, but did not injury his head. He developed sharp, non-radiating right proximal LE pain. No numbness or weakness in his right leg. He denies LOC. Patient does not have chest pain, shortness of breath, cough, abdominal pain, diarrhea, symptoms of UTI or unilateral weakness.  In ED, patient was found to have displaced right femoral neck fracture by x-ray, WBC 8.9, temperature normal, mildly tachycardia, potassium 5.5, worsening renal function. Check say showed mild peribronchial thickening. Patient is admitted to inpatient for further evaluation and treatment. Orthopedic surgeon was consulted by ED.  EKG: Independently reviewed. QTC 486, has mild T-wave peaking in V2-V3  Where does patient live?   At home     Can patient participate in ADLs? Barely   Review of Systems:   General: no fevers, chills, no changes in body weight, has fatigue HEENT: no blurry vision, hearing changes or sore throat Pulm: no dyspnea, coughing, wheezing CV: no chest pain, palpitations Abd: no nausea, vomiting, abdominal pain, diarrhea, constipation GU: no dysuria, burning on urination, increased urinary frequency, hematuria  Ext: no leg edema Neuro: no unilateral weakness, numbness, or tingling, no vision change or hearing loss Skin: no rash MSK: has right hip pain.No muscle spasm, no deformity, no limitation of range of movement in spin Heme: No easy  bruising.  Travel history: No recent long distant travel.  Allergy: No Known Allergies  Past Medical History  Diagnosis Date  . BPH (benign prostatic hyperplasia)   . H/O tongue cancer   . Cataract   . CKD (chronic kidney disease), stage III   . Parkinson's syndrome [facility])     Past Surgical History  Procedure Laterality Date  . Tonsillectomy and adenoidectomy    . Transurethral resection of prostate    . Appendectomy    . Eye surgery      Social History:  reports that he has quit smoking. He does not have any smokeless tobacco history on file. He reports that he does not drink alcohol or use illicit drugs.  Family History:  Family History  Problem Relation Age of Onset  . Dementia Mother      Prior to Admission medications   Medication Sig Start Date End Date Taking? Authorizing Provider  calcium carbonate (TUMS - DOSED IN MG ELEMENTAL CALCIUM) 500 MG chewable tablet Chew 1 tablet by mouth daily as needed for indigestion or heartburn.   Yes Historical Provider, MD  Multiple Vitamin (MULTIVITAMIN WITH MINERALS) TABS tablet Take 1 tablet by mouth daily.   Yes Historical Provider, MD  Naproxen Sod-Diphenhydramine 220-25 MG TABS Take 1 tablet by mouth at bedtime as needed (sleep).   Yes Historical Provider, MD  rOPINIRole (REQUIP) 1 MG tablet Take 1 mg by mouth at bedtime. 12/05/15  Yes Historical Provider, MD    Physical Exam: Filed Vitals:   01/07/16 1710 01/07/16 1845 01/07/16 1926  BP: 140/83  153/86  Pulse: 73 72 100  Temp: 98 F (36.7 C)  TempSrc: Oral    Resp: 16 20 18   SpO2: 94% 96% 98%   General: Not in acute distress HEENT:       Eyes: PERRL, EOMI, no scleral icterus.       ENT: No discharge from the ears and nose, no pharynx injection, no tonsillar enlargement.        Neck: No JVD, no bruit, no mass felt. Heme: No neck lymph node enlargement. Cardiac: S1/S2, RRR, No murmurs, No gallops or rubs. Pulm: Good air movement bilaterally. No rales, wheezing,  rhonchi or rubs. Abd: Soft, nondistended, nontender, no rebound pain, no organomegaly, BS present. Ext: No pitting leg edema bilaterally. 2+DP/PT pulse bilaterally. Musculoskeletal: TTP of right thigh, decreased ROM of right hip and knee 2/2 pain.  Skin: No rashes.  Neuro: Alert, oriented X3, cranial nerves II-XII grossly intact, muscle strength 5/5 in all extremities, sensation to light touch intact.  Psych: Patient is not psychotic, no suicidal or hemocidal ideation.  Labs on Admission:  Basic Metabolic Panel:  Recent Labs Lab 01/07/16 1655  NA 137  K 5.5*  CL 104  CO2 24  GLUCOSE 137*  BUN 27*  CREATININE 1.33*  CALCIUM 9.4   Liver Function Tests: No results for input(s): AST, ALT, ALKPHOS, BILITOT, PROT, ALBUMIN in the last 168 hours. No results for input(s): LIPASE, AMYLASE in the last 168 hours. No results for input(s): AMMONIA in the last 168 hours. CBC:  Recent Labs Lab 01/07/16 1655  WBC 8.9  NEUTROABS 7.7  HGB 14.7  HCT 43.2  MCV 97.1  PLT 159   Cardiac Enzymes: No results for input(s): CKTOTAL, CKMB, CKMBINDEX, TROPONINI in the last 168 hours.  BNP (last 3 results) No results for input(s): BNP in the last 8760 hours.  ProBNP (last 3 results) No results for input(s): PROBNP in the last 8760 hours.  CBG: No results for input(s): GLUCAP in the last 168 hours.  Radiological Exams on Admission: Dg Chest 1 View  01/07/2016  CLINICAL DATA:  Fall today with right hip fracture. EXAM: CHEST 1 VIEW COMPARISON:  None. FINDINGS: Lung volumes are low leading to crowding of bronchovascular structures. Pulmonary vasculature is difficult to assess due to low lung volumes and portable technique. Mild peribronchial thickening. There is elevation of the left hemidiaphragm. The heart is normal in size. No confluent airspace disease. Minimal blunting of left costophrenic angle, suspect this is chronic. No pneumothorax. The bones are under mineralized. No acute osseous  abnormalities are seen. IMPRESSION: Hypoventilatory chest with crowding of bronchovascular structures. Mild peribronchial thickening. Elevation of the left hemidiaphragm. Blunting of the left costophrenic angle, suspect this is chronic. Electronically Signed   By: [name]   On: 01/07/2016 18:09   Dg Hip Unilat With Pelvis 2-3 Views Right  01/07/2016  CLINICAL DATA:  Right hip pain and ankle pain.  Status post fall. EXAM: DG HIP (WITH OR WITHOUT PELVIS) 2-3V RIGHT COMPARISON:  None. FINDINGS: There is generalized osteopenia. There is a mildly angulated and displaced right femoral neck fracture. There is no other fracture or dislocation. There are degenerative changes of the lower lumbar spine. IMPRESSION: Mildly angulated and displaced right femoral neck fracture. Electronically Signed   By: [name]   On: 01/07/2016 16:03   Dg Femur, Min 2 Views Right  01/07/2016  CLINICAL DATA:  Fall with right hip pain.  Initial encounter. EXAM: RIGHT FEMUR 2 VIEWS COMPARISON:  None. FINDINGS: Acute right femoral neck fracture with medial impaction. The fracture is  trans to basi cervical. No evidence of intertrochanteric extension. The remainder of the femur is intact. Osteopenia. IMPRESSION: Impacted right femoral neck fracture. Electronically Signed   By: [name]   On: 01/07/2016 16:02    Assessment/Plan Principal Problem:   Fracture of femoral neck, right, closed Active Problems:   BPH (benign prostatic hyperplasia)   Hyperkalemia   Acute renal failure superimposed on stage 3 chronic kidney disease (HCC)   Parkinson's syndrome (HCC)   Fracture of femoral neck-right: As evidenced by x-ray. Patient has moderate pain now. No neurovascular compromise. Orthopedic surgeon was consulted, plan to do surgery tomorrow.  - will admit to tele bed due to hyperkalemia - Pain control: morphine prn and Norco - follow up ortho recs - NPO after MN - type and cross and INR/PTT - Bucks  traction per ortho  Hyperkalemia: K 5.5. This is likely due to worsening renal function. EKG showed mild T-wave peaking in V2-V3 -Calcium chloride, 1g IV -5 units of novolog and D50 25 ml -Kayexalate 30 g 1 -Repeat BMP at 23:55 PM -IVF  AoCKD-III: Baseline Cre is 1.26 on 04/07/15, his Cre is 1.33 and BUN 27on admission, slightly worsening renal function. Likely due to prerenal secondary to dehydration and continuation of NSAIDs.  - IVF: 1L of NS, then 100 cc/h - Check FeNa  - Follow up renal function by BMP - Hold naproxen  Parkinson's syndrome ([place]): stable -On requip   DVT ppx: SCD Code Status: DNR Family Communication: Yes, patient's  daughter     at bed side Disposition Plan: Admit to inpatient   Date of Service 01/07/2016    Ivor Costa [facility] Pager [phone]  If 7PM-7AM, please contact night-coverage [email or web address] Password [facility] 01/07/2016, 8:34 PM

## 2016-01-07 NOTE — ED Notes (Signed)
Per EMS-patient was doing exercises with band-lost his grip and fell back-did not hit head-complaining right hip and right ankle pain-slight shorting, when palpated right hip intact

## 2016-01-07 NOTE — ED Provider Notes (Signed)
CSN: [id number]     Arrival date & time 01/07/16  1446 History   First MD Initiated Contact with Patient 01/07/16 1458     Chief Complaint  Patient presents with  . Fall     (Consider location/radiation/quality/duration/timing/severity/associated sxs/prior Treatment) Patient is a 82 y.o. male presenting with fall. The history is provided by the patient and medical records. No language interpreter was used.  Fall Associated symptoms include arthralgias and myalgias. Pertinent negatives include no abdominal pain, congestion, coughing, headaches, nausea, neck pain, rash, sore throat, vomiting or weakness.  SUREN PICKERT is a 82 y.o. male  With PMH of Parkinson's, BPH who presents to the Emergency Department after a mechanical fall ~ 3 hours ago. Patient was doing physical therapy exercises with a band and lost his grip on the band. Pt. Fell back and to the right, landing on his right leg/hip. At present, patient is complaining of sharp, non-radiating right proximal LE pain. Denies hitting head, denies LOC, no blood thinners.    Past Medical History  Diagnosis Date  . BPH (benign prostatic hyperplasia)   . H/O tongue cancer   . Cataract   . CKD (chronic kidney disease), stage III    Past Surgical History  Procedure Laterality Date  . Tonsillectomy and adenoidectomy    . Transurethral resection of prostate    . Appendectomy    . Eye surgery     No family history on file. Social History  Substance Use Topics  . Smoking status: Former Research scientist (life sciences)  . Smokeless tobacco: None  . Alcohol Use: No    Review of Systems  Constitutional: Negative.   HENT: Negative for congestion and sore throat.   Eyes: Negative for visual disturbance.  Respiratory: Negative for cough, shortness of breath and wheezing.   Cardiovascular: Negative.   Gastrointestinal: Negative for nausea, vomiting, abdominal pain, diarrhea and constipation.  Musculoskeletal: Positive for myalgias and arthralgias. Negative for  back pain and neck pain.  Skin: Negative for rash.  Neurological: Negative for dizziness, weakness and headaches.  Hematological: Does not bruise/bleed easily.      Allergies  Review of patient's allergies indicates no known allergies.  Home Medications   Prior to Admission medications   Medication Sig Start Date End Date Taking? Authorizing Provider  calcium carbonate (TUMS - DOSED IN MG ELEMENTAL CALCIUM) 500 MG chewable tablet Chew 1 tablet by mouth daily as needed for indigestion or heartburn.   Yes Historical Provider, MD  Multiple Vitamin (MULTIVITAMIN WITH MINERALS) TABS tablet Take 1 tablet by mouth daily.   Yes Historical Provider, MD  Naproxen Sod-Diphenhydramine 220-25 MG TABS Take 1 tablet by mouth at bedtime as needed (sleep).   Yes Historical Provider, MD  rOPINIRole (REQUIP) 1 MG tablet Take 1 mg by mouth at bedtime. 12/05/15  Yes Historical Provider, MD   BP 153/86 mmHg  Pulse 100  Temp(Src) 98 F (36.7 C) (Oral)  Resp 18  SpO2 98% Physical Exam  Constitutional: He is oriented to person, place, and time. He appears well-developed and well-nourished.  Alert, hard-of-hearing, and in no acute distress  HENT:  Head: Normocephalic and atraumatic. Head is without raccoon's eyes and without Battle's sign.  Right Ear: No hemotympanum.  Left Ear: No hemotympanum.  Neck: Normal range of motion.  Full ROM, no TTP.  Cardiovascular: Normal rate, regular rhythm and normal heart sounds.  Exam reveals no gallop and no friction rub.   No murmur heard. Bilateral LE pulses equal and intact.  Pulmonary/Chest: Effort  normal and breath sounds normal. No respiratory distress. He has no wheezes. He has no rales.  Abdominal: He exhibits no mass. There is no rebound and no guarding.  Abdomen soft, non-tender, non-distended Bowel sounds positive in all four quadrants  Musculoskeletal: He exhibits edema.  TTP of right thigh Decreased ROM of right hip and knee 2/2 pain.  Neurological:  He is alert and oriented to person, place, and time.  Skin: Skin is warm and dry. No rash noted.  Psychiatric: He has a normal mood and affect. His behavior is normal. Judgment and thought content normal.  Nursing note and vitals reviewed.   ED Course  Procedures (including critical care time) Labs Review Labs Reviewed  BASIC METABOLIC PANEL - Abnormal; Notable for the following:    Potassium 5.5 (*)    Glucose, Bld 137 (*)    BUN 27 (*)    Creatinine, Ser 1.33 (*)    GFR calc non Af Amer 44 (*)    GFR calc Af Amer 51 (*)    All other components within normal limits  CBC WITH DIFFERENTIAL/PLATELET  PROTIME-INR  APTT    Imaging Review Dg Chest 1 View  01/07/2016  CLINICAL DATA:  Fall today with right hip fracture. EXAM: CHEST 1 VIEW COMPARISON:  None. FINDINGS: Lung volumes are low leading to crowding of bronchovascular structures. Pulmonary vasculature is difficult to assess due to low lung volumes and portable technique. Mild peribronchial thickening. There is elevation of the left hemidiaphragm. The heart is normal in size. No confluent airspace disease. Minimal blunting of left costophrenic angle, suspect this is chronic. No pneumothorax. The bones are under mineralized. No acute osseous abnormalities are seen. IMPRESSION: Hypoventilatory chest with crowding of bronchovascular structures. Mild peribronchial thickening. Elevation of the left hemidiaphragm. Blunting of the left costophrenic angle, suspect this is chronic. Electronically Signed   By: [name]   On: 01/07/2016 18:09   Dg Hip Unilat With Pelvis 2-3 Views Right  01/07/2016  CLINICAL DATA:  Right hip pain and ankle pain.  Status post fall. EXAM: DG HIP (WITH OR WITHOUT PELVIS) 2-3V RIGHT COMPARISON:  None. FINDINGS: There is generalized osteopenia. There is a mildly angulated and displaced right femoral neck fracture. There is no other fracture or dislocation. There are degenerative changes of the lower lumbar  spine. IMPRESSION: Mildly angulated and displaced right femoral neck fracture. Electronically Signed   By: [name]   On: 01/07/2016 16:03   Dg Femur, Min 2 Views Right  01/07/2016  CLINICAL DATA:  Fall with right hip pain.  Initial encounter. EXAM: RIGHT FEMUR 2 VIEWS COMPARISON:  None. FINDINGS: Acute right femoral neck fracture with medial impaction. The fracture is trans to basi cervical. No evidence of intertrochanteric extension. The remainder of the femur is intact. Osteopenia. IMPRESSION: Impacted right femoral neck fracture. Electronically Signed   By: [name]   On: 01/07/2016 16:02   I have personally reviewed and evaluated these images and lab results as part of my medical decision-making.   EKG Interpretation None      MDM   Final diagnoses:  Fall  Closed fracture of neck of right femur, initial encounter ([place])  Hyperkalemia   Levi Aland presents with after a mechanical fall for right thigh pain.   Labs: BMP with K+ of 5.5, BUN 27, Cr 1.33  - EKG and 1L IVF ordered.  CBC, PT/INR, aptt wdl  Imaging: x-rays show right femoral neck fracture  Consults: ortho  who recommend admission to hospitalist and consult them. Will do hemi-arthroplasty tomorrow evening.   Therapeutics: Pain control while in ED, 1L IV fluids   A&P: Femoral neck fracture  - Admit to medicine for further evaluation and management  Patient seen by and discussed with Dr. Oleta Mouse who agrees with treatment plan.   Ozella Almond Alandria Butkiewicz, PA-C 01/07/16 2001  Forde Dandy, MD 01/08/16 [phone]

## 2016-01-07 NOTE — Clinical Social Work Note (Signed)
Clinical Social Work Assessment  Patient Details  Name: Nicholas Andrews MRN: [id number] Date of Birth: 01/12/1918  Date of referral:  01/07/16               Reason for consult:   (Daughter is interested in SNF for patient. )                Permission sought to share information with:   (None.) Permission granted to share information::  No  Name::        Agency::     Relationship::     Contact Information:     Housing/Transportation Living arrangements for the past 2 months:  Single Family Home (Daughter informed CSW that the patient has been living at home in [place] with his wife.) Source of Information:  Adult Children (Patient was sleep during the first half of assessment. ) Patient Interpreter Needed:  None Criminal Activity/Legal Involvement Pertinent to Current Situation/Hospitalization:  No - Comment as needed Significant Relationships:  Adult Children (Daughter states she is the POA for patient. ) Lives with:  Spouse Do you feel safe going back to the place where you live?  Yes (Daughter expressed if rehab is needed she would like for the patient to go to [facility].) Need for family participation in patient care:  Yes (Comment) (Daughter states she is POA for patient. She states she lives in [place].)  Care giving concerns:  Daughter states that she is interested in rehab doe patient. She states that she would like for the patient to go to [facility]. CSW informed daughter that she could not guarantee that  [facility] will offer the patient a  Bed.    Social Worker assessment / plan:  CSW attempted to meet with patient. However, patient was asleep during first half of session. Daughter was present. Daughter also states that patient is hard of hearing.  Daughter confirms that patient presents to [facility] due to fall. Daughter states " He was doing some Physical Therapy on his arm with the stretch bands. He came out of his grip and he fell backwards". According to daughter,  the patient does not fall often. Daughter states that this is the only falling incident for patient within the past 6 months.   Daughter states that the patient has care givers from the agency " [employer] " for 24 hours/ 7 days a week. She states that the patient has a great support system and that she is POA for patient.   Jenny Reichmann Reginaldi/ Daughter [phone]    Employment status:  Retired Forensic scientist:  Medicare PT Recommendations:  Not assessed at this time Information / Referral to community resources:   (CSW gave daughter a list of SNF.)  Patient/Family's Response to care:  Daughter and patient are aware of admission. Both are accepting.  Patient/Family's Understanding of and Emotional Response to Diagnosis, Current Treatment, and Prognosis:  Daughter and patient state that they do not have any questions at this time.  Emotional Assessment Appearance:  Appears stated age Attitude/Demeanor/Rapport:   (Patient was sleep during the first half of the assessment.) Affect (typically observed):  Accepting Orientation:  Oriented to Self, Oriented to Place, Oriented to Situation Alcohol / Substance use:  Not Applicable Psych involvement (Current and /or in the community):  No (Comment)  Discharge Needs  Concerns to be addressed:  Adjustment to Illness Readmission within the last 30 days:  No Current discharge risk:  None Barriers to Discharge:  No Barriers  Identified   Bernita Buffy, LCSW 01/07/2016, 8:51 PM

## 2016-01-07 NOTE — ED Notes (Signed)
Bed: [facility] Expected date:  Expected time:  Means of arrival:  Comments: Ems- fall

## 2016-01-08 ENCOUNTER — Inpatient Hospital Stay (HOSPITAL_COMMUNITY): Payer: Medicare Other | Admitting: Anesthesiology

## 2016-01-08 ENCOUNTER — Encounter (HOSPITAL_COMMUNITY)
Admission: EM | Disposition: A | Discharge status: Skilled Nursing Facility | Payer: Self-pay | Source: Home / Self Care | Attending: Internal Medicine

## 2016-01-08 ENCOUNTER — Encounter (HOSPITAL_COMMUNITY): Payer: Self-pay | Admitting: Certified Registered"

## 2016-01-08 ENCOUNTER — Inpatient Hospital Stay (HOSPITAL_COMMUNITY): Payer: Medicare Other

## 2016-01-08 DIAGNOSIS — N179 Acute kidney failure, unspecified: Secondary | ICD-10-CM

## 2016-01-08 DIAGNOSIS — E875 Hyperkalemia: Secondary | ICD-10-CM

## 2016-01-08 DIAGNOSIS — G20 Parkinson's disease: Secondary | ICD-10-CM

## 2016-01-08 DIAGNOSIS — N183 Chronic kidney disease, stage 3 (moderate): Secondary | ICD-10-CM

## 2016-01-08 DIAGNOSIS — N400 Enlarged prostate without lower urinary tract symptoms: Secondary | ICD-10-CM

## 2016-01-08 HISTORY — PX: HIP ARTHROPLASTY: SHX981

## 2016-01-08 LAB — BASIC METABOLIC PANEL
ANION GAP: 7 (ref 5–15)
Anion gap: 8 (ref 5–15)
BUN: 23 mg/dL — ABNORMAL HIGH (ref 6–20)
BUN: 24 mg/dL — ABNORMAL HIGH (ref 6–20)
CHLORIDE: 106 mmol/L (ref 101–111)
CHLORIDE: 108 mmol/L (ref 101–111)
CO2: 23 mmol/L (ref 22–32)
CO2: 23 mmol/L (ref 22–32)
CREATININE: 1.08 mg/dL (ref 0.61–1.24)
Calcium: 8.9 mg/dL (ref 8.9–10.3)
Calcium: 8.9 mg/dL (ref 8.9–10.3)
Creatinine, Ser: 1.09 mg/dL (ref 0.61–1.24)
GFR calc non Af Amer: 56 mL/min — ABNORMAL LOW (ref >60)
GFR calc non Af Amer: 56 mL/min — ABNORMAL LOW (ref >60)
Glucose, Bld: 149 mg/dL — ABNORMAL HIGH (ref 65–99)
Glucose, Bld: 158 mg/dL — ABNORMAL HIGH (ref 65–99)
POTASSIUM: 4.6 mmol/L (ref 3.5–5.1)
POTASSIUM: 5.1 mmol/L (ref 3.5–5.1)
SODIUM: 137 mmol/L (ref 135–145)
SODIUM: 138 mmol/L (ref 135–145)

## 2016-01-08 LAB — CBC
HEMATOCRIT: 39.1 % (ref 39.0–52.0)
Hemoglobin: 13.2 g/dL (ref 13.0–17.0)
MCH: 32.7 pg (ref 26.0–34.0)
MCHC: 33.8 g/dL (ref 30.0–36.0)
MCV: 96.8 fL (ref 78.0–100.0)
PLATELETS: 156 10*3/uL (ref 150–400)
RBC: 4.04 MIL/uL — AB (ref 4.22–5.81)
RDW: 12.4 % (ref 11.5–15.5)
WBC: 7.6 10*3/uL (ref 4.0–10.5)

## 2016-01-08 LAB — SURGICAL PCR SCREEN
MRSA, PCR: NEGATIVE
Staphylococcus aureus: NEGATIVE

## 2016-01-08 LAB — GLUCOSE, CAPILLARY: Glucose-Capillary: 127 mg/dL — ABNORMAL HIGH (ref 65–99)

## 2016-01-08 LAB — SODIUM, URINE, RANDOM: Sodium, Ur: 155 mmol/L

## 2016-01-08 LAB — CREATININE, URINE, RANDOM: Creatinine, Urine: 59.52 mg/dL

## 2016-01-08 SURGERY — HEMIARTHROPLASTY, HIP, DIRECT ANTERIOR APPROACH, FOR FRACTURE
Anesthesia: General | Site: Hip | Laterality: Right

## 2016-01-08 MED ORDER — ONDANSETRON HCL 4 MG/2ML IJ SOLN: 4.0000 mg | Freq: Four times a day (QID) | INTRAMUSCULAR | Status: DC | PRN

## 2016-01-08 MED ORDER — ONDANSETRON HCL 4 MG PO TABS: 4.0000 mg | ORAL_TABLET | Freq: Four times a day (QID) | ORAL | Status: DC | PRN

## 2016-01-08 MED ORDER — HYDROMORPHONE HCL 1 MG/ML IJ SOLN
0.2500 mg | INTRAMUSCULAR | Status: DC | PRN
  Administered 2016-01-08 (×4): 0.25 mg via INTRAVENOUS

## 2016-01-08 MED ORDER — ACETAMINOPHEN 650 MG RE SUPP: 650.0000 mg | Freq: Four times a day (QID) | RECTAL | Status: DC | PRN

## 2016-01-08 MED ORDER — CEFAZOLIN SODIUM-DEXTROSE 2-3 GM-% IV SOLR
INTRAVENOUS | Status: AC
  Filled 2016-01-08: qty 50

## 2016-01-08 MED ORDER — SODIUM CHLORIDE 0.45 % IV SOLN
INTRAVENOUS | Status: DC
  Administered 2016-01-08: 20:00:00 via INTRAVENOUS

## 2016-01-08 MED ORDER — SUCCINYLCHOLINE CHLORIDE 20 MG/ML IJ SOLN
INTRAMUSCULAR | Status: DC | PRN
  Administered 2016-01-08: 100 mg via INTRAVENOUS

## 2016-01-08 MED ORDER — HYDROCODONE-ACETAMINOPHEN 5-325 MG PO TABS: 1.0000 | ORAL_TABLET | Freq: Four times a day (QID) | ORAL | Status: DC | PRN

## 2016-01-08 MED ORDER — ONDANSETRON HCL 4 MG/2ML IJ SOLN
INTRAMUSCULAR | Status: DC | PRN
  Administered 2016-01-08: 4 mg via INTRAVENOUS

## 2016-01-08 MED ORDER — ENOXAPARIN SODIUM 30 MG/0.3ML ~~LOC~~ SOLN
30.0000 mg | SUBCUTANEOUS | Status: DC
  Administered 2016-01-09 – 2016-01-11 (×3): 30 mg via SUBCUTANEOUS
  Filled 2016-01-08 (×2): qty 0.3

## 2016-01-08 MED ORDER — LIDOCAINE HCL (CARDIAC) 20 MG/ML IV SOLN
INTRAVENOUS | Status: AC
  Filled 2016-01-08: qty 5

## 2016-01-08 MED ORDER — PROPOFOL 10 MG/ML IV BOLUS
INTRAVENOUS | Status: DC | PRN
  Administered 2016-01-08: 120 mg via INTRAVENOUS

## 2016-01-08 MED ORDER — PHENOL 1.4 % MT LIQD
1.0000 | OROMUCOSAL | Status: DC | PRN
  Filled 2016-01-08: qty 177

## 2016-01-08 MED ORDER — OXYCODONE HCL 5 MG PO TABS
5.0000 mg | ORAL_TABLET | ORAL | Status: DC | PRN
  Administered 2016-01-11: 5 mg via ORAL
  Filled 2016-01-08: qty 1

## 2016-01-08 MED ORDER — BUPIVACAINE HCL (PF) 0.5 % IJ SOLN
INTRAMUSCULAR | Status: AC
  Filled 2016-01-08: qty 30

## 2016-01-08 MED ORDER — CEFAZOLIN SODIUM-DEXTROSE 2-3 GM-% IV SOLR
2.0000 g | Freq: Once | INTRAVENOUS | Status: AC
  Administered 2016-01-08: 2 g via INTRAVENOUS

## 2016-01-08 MED ORDER — LACTATED RINGERS IV SOLN
INTRAVENOUS | Status: DC
  Administered 2016-01-08: 1000 mL via INTRAVENOUS

## 2016-01-08 MED ORDER — METOCLOPRAMIDE HCL 10 MG PO TABS: 5.0000 mg | ORAL_TABLET | Freq: Three times a day (TID) | ORAL | Status: DC | PRN

## 2016-01-08 MED ORDER — ONDANSETRON HCL 4 MG/2ML IJ SOLN
INTRAMUSCULAR | Status: AC
  Filled 2016-01-08: qty 2

## 2016-01-08 MED ORDER — ACETAMINOPHEN 325 MG PO TABS: 650.0000 mg | ORAL_TABLET | Freq: Four times a day (QID) | ORAL | Status: DC | PRN

## 2016-01-08 MED ORDER — DOCUSATE SODIUM 100 MG PO CAPS
100.0000 mg | ORAL_CAPSULE | Freq: Two times a day (BID) | ORAL | Status: DC
  Administered 2016-01-09 – 2016-01-11 (×5): 100 mg via ORAL
  Filled 2016-01-08 (×5): qty 1

## 2016-01-08 MED ORDER — FENTANYL CITRATE (PF) 100 MCG/2ML IJ SOLN
INTRAMUSCULAR | Status: DC | PRN
  Administered 2016-01-08: 50 ug via INTRAVENOUS

## 2016-01-08 MED ORDER — PHENYLEPHRINE 40 MCG/ML (10ML) SYRINGE FOR IV PUSH (FOR BLOOD PRESSURE SUPPORT)
PREFILLED_SYRINGE | INTRAVENOUS | Status: AC
  Filled 2016-01-08: qty 10

## 2016-01-08 MED ORDER — PROPOFOL 10 MG/ML IV BOLUS
INTRAVENOUS | Status: AC
  Filled 2016-01-08: qty 20

## 2016-01-08 MED ORDER — PROMETHAZINE HCL 25 MG/ML IJ SOLN: 6.2500 mg | INTRAMUSCULAR | Status: DC | PRN

## 2016-01-08 MED ORDER — HYDROMORPHONE HCL 1 MG/ML IJ SOLN
INTRAMUSCULAR | Status: AC
  Filled 2016-01-08: qty 1

## 2016-01-08 MED ORDER — MENTHOL 3 MG MT LOZG
1.0000 | LOZENGE | OROMUCOSAL | Status: DC | PRN
  Filled 2016-01-08: qty 9

## 2016-01-08 MED ORDER — PHENYLEPHRINE HCL 10 MG/ML IJ SOLN
INTRAMUSCULAR | Status: DC | PRN
  Administered 2016-01-08 (×6): 80 ug via INTRAVENOUS

## 2016-01-08 MED ORDER — MORPHINE SULFATE (PF) 2 MG/ML IV SOLN
2.0000 mg | INTRAVENOUS | Status: DC | PRN
  Administered 2016-01-09 – 2016-01-11 (×6): 2 mg via INTRAVENOUS
  Filled 2016-01-08 (×6): qty 1

## 2016-01-08 MED ORDER — 0.9 % SODIUM CHLORIDE (POUR BTL) OPTIME
TOPICAL | Status: DC | PRN
  Administered 2016-01-08: 1000 mL

## 2016-01-08 MED ORDER — LIDOCAINE HCL (CARDIAC) 20 MG/ML IV SOLN
INTRAVENOUS | Status: DC | PRN
  Administered 2016-01-08: 50 mg via INTRAVENOUS

## 2016-01-08 MED ORDER — METOCLOPRAMIDE HCL 5 MG/ML IJ SOLN: 5.0000 mg | Freq: Three times a day (TID) | INTRAMUSCULAR | Status: DC | PRN

## 2016-01-08 MED ORDER — METHOCARBAMOL 1000 MG/10ML IJ SOLN
500.0000 mg | Freq: Three times a day (TID) | INTRAVENOUS | Status: DC
  Filled 2016-01-08 (×5): qty 5

## 2016-01-08 MED ORDER — FLEET ENEMA 7-19 GM/118ML RE ENEM: 1.0000 | ENEMA | Freq: Once | RECTAL | Status: DC | PRN

## 2016-01-08 MED ORDER — FENTANYL CITRATE (PF) 100 MCG/2ML IJ SOLN
INTRAMUSCULAR | Status: AC
  Filled 2016-01-08: qty 2

## 2016-01-08 MED ORDER — FENTANYL CITRATE (PF) 100 MCG/2ML IJ SOLN
25.0000 ug | INTRAMUSCULAR | Status: DC | PRN
  Administered 2016-01-08 (×4): 25 ug via INTRAVENOUS

## 2016-01-08 MED ORDER — BISACODYL 10 MG RE SUPP: 10.0000 mg | Freq: Every day | RECTAL | Status: DC | PRN

## 2016-01-08 MED ORDER — BUPIVACAINE HCL 0.5 % IJ SOLN
INTRAMUSCULAR | Status: DC | PRN
  Administered 2016-01-08: 28 mL

## 2016-01-08 SURGICAL SUPPLY — 42 items
BLADE SAW SAG 73X25 THK (BLADE) ×2
BLADE SAW SGTL 73X25 THK (BLADE) ×1 IMPLANT
CAPT HIP HEMI 1 ×2 IMPLANT
DRAPE INCISE IOBAN 66X45 STRL (DRAPES) ×3 IMPLANT
DRAPE ORTHO SPLIT 77X108 STRL (DRAPES) ×6
DRAPE POUCH INSTRU U-SHP 10X18 (DRAPES) ×3 IMPLANT
DRAPE SURG ORHT 6 SPLT 77X108 (DRAPES) ×2 IMPLANT
DRAPE U-SHAPE 47X51 STRL (DRAPES) ×3 IMPLANT
DRAPE WARM FLUID 44X44 (DRAPE) ×3 IMPLANT
DRSG PAD ABDOMINAL 8X10 ST (GAUZE/BANDAGES/DRESSINGS) ×2 IMPLANT
DURAPREP 26ML APPLICATOR (WOUND CARE) ×3 IMPLANT
ELECT BLADE TIP CTD 4 INCH (ELECTRODE) ×3 IMPLANT
ELECT REM PT RETURN 9FT ADLT (ELECTROSURGICAL) ×3
ELECTRODE REM PT RTRN 9FT ADLT (ELECTROSURGICAL) ×1 IMPLANT
EVACUATOR 1/8 PVC DRAIN (DRAIN) ×3 IMPLANT
FACESHIELD WRAPAROUND (MASK) ×6 IMPLANT
FACESHIELD WRAPAROUND OR TEAM (MASK) ×5 IMPLANT
GAUZE SPONGE 4X4 12PLY STRL (GAUZE/BANDAGES/DRESSINGS) ×4 IMPLANT
GAUZE XEROFORM 5X9 LF (GAUZE/BANDAGES/DRESSINGS) ×3 IMPLANT
GLOVE BIOGEL PI IND STRL 8 (GLOVE) ×1 IMPLANT
GLOVE BIOGEL PI INDICATOR 8 (GLOVE) ×2
GLOVE ORTHO TXT STRL SZ7.5 (GLOVE) ×3 IMPLANT
GOWN STRL REUS W/TWL LRG LVL3 (GOWN DISPOSABLE) ×3 IMPLANT
IMMOBILIZER KNEE 20 (SOFTGOODS) ×3
IMMOBILIZER KNEE 20 THIGH 36 (SOFTGOODS) ×1 IMPLANT
KIT BASIN OR (CUSTOM PROCEDURE TRAY) ×3 IMPLANT
NDL HYPO 25X1 1.5 SAFETY (NEEDLE) IMPLANT
NDL MAYO CATGUT SZ4 TPR NDL (NEEDLE) ×1 IMPLANT
NEEDLE HYPO 22GX1.5 SAFETY (NEEDLE) ×2 IMPLANT
NEEDLE HYPO 25X1 1.5 SAFETY (NEEDLE) ×3 IMPLANT
NEEDLE MAYO CATGUT SZ4 (NEEDLE) ×3 IMPLANT
NS IRRIG 1000ML POUR BTL (IV SOLUTION) ×6 IMPLANT
PACK TOTAL JOINT (CUSTOM PROCEDURE TRAY) ×3 IMPLANT
PASSER SUT SWANSON 36MM LOOP (INSTRUMENTS) ×3 IMPLANT
STAPLER VISISTAT 35W (STAPLE) ×3 IMPLANT
SUT ETHIBOND NAB CT1 #1 30IN (SUTURE) ×8 IMPLANT
SUT VIC AB 1 CTX 36 (SUTURE) ×6
SUT VIC AB 1 CTX36XBRD ANBCTR (SUTURE) IMPLANT
SUT VIC AB 2-0 CT1 36 (SUTURE) ×7 IMPLANT
SYR 30ML LL (SYRINGE) ×2 IMPLANT
TOWEL OR 17X26 10 PK STRL BLUE (TOWEL DISPOSABLE) ×4 IMPLANT
WATER STERILE IRR 500ML POUR (IV SOLUTION) ×4 IMPLANT

## 2016-01-08 NOTE — Clinical Social Work Placement (Signed)
   CLINICAL SOCIAL WORK PLACEMENT  NOTE  Date:  01/08/2016  Patient Details  Name: Nicholas Andrews MRN: [id number] Date of Birth: 1918/05/26  Clinical Social Work is seeking post-discharge placement for this patient at the [place] level of care (*CSW will initial, date and re-position this form in  chart as items are completed):  Yes   Patient/family provided with [place] Work Department's list of facilities offering this level of care within the geographic area requested by the patient (or if unable, by the patient's family).  Yes   Patient/family informed of their freedom to choose among providers that offer the needed level of care, that participate in Medicare, Medicaid or managed care program needed by the patient, have an available bed and are willing to accept the patient.  Yes   Patient/family informed of [facility]'s ownership interest in [facility] and [facility], as well as of the fact that they are under no obligation to receive care at these facilities.  PASRR submitted to EDS on 01/08/16     PASRR number received on 01/08/16     Existing PASRR number confirmed on       FL2 transmitted to all facilities in geographic area requested by pt/family on 01/08/16     FL2 transmitted to all facilities within larger geographic area on       Patient informed that his/her managed care company has contracts with or will negotiate with certain facilities, including the following:            Patient/family informed of bed offers received.  Patient chooses bed at       Physician recommends and patient chooses bed at      Patient to be transferred to   on  .  Patient to be transferred to facility by       Patient family notified on   of transfer.  Name of family member notified:        PHYSICIAN       Additional Comment:    _______________________________________________ Standley Brooking, LCSW 01/08/2016, 12:07 PM

## 2016-01-08 NOTE — Progress Notes (Signed)
OT Cancellation Note  Patient Details Name: [name] MRN: [id number] DOB: 05/09/1918   Cancelled Treatment:    Reason Eval/Treat Not Completed: Other (comment)  For sx today. Please reorder.  Leelyn Jasinski 01/08/2016, 7:16 AM  Lesle Chris, OTR/L [phone] 01/08/2016

## 2016-01-08 NOTE — H&P (View-Only) (Signed)
Subjective:   Procedure(s) (LRB): Right Hip Hemiarthroplasty (Right) Patient reports pain as mild.    Objective: Vital signs in last 24 hours: Temp:  [98 F (36.7 C)-98.3 F (36.8 C)] 98.3 F (36.8 C) (01/11 0413) Pulse Rate:  [72-105] 105 (01/11 0413) Resp:  [16-20] 18 (01/11 0413) BP: (140-166)/(76-86) 156/81 mmHg (01/11 0413) SpO2:  [92 %-98 %] 92 % (01/11 0413) Weight:  [92.9 kg (204 lb 12.9 oz)] 92.9 kg (204 lb 12.9 oz) (01/10 2206)  Intake/Output from previous day: 01/10 0701 - 01/11 0700 In: 1090 [I.V.:980; IV Piggyback:110] Out: 550 [Urine:550] Intake/Output this shift:     Recent Labs  01/07/16 1655 01/08/16 0510  HGB 14.7 13.2    Recent Labs  01/07/16 1655 01/08/16 0510  WBC 8.9 7.6  RBC 4.45 4.04*  HCT 43.2 39.1  PLT 159 156    Recent Labs  01/07/16 2340 01/08/16 0510  NA 138 137  K 5.1 4.6  CL 108 106  CO2 23 23  BUN 24* 23*  CREATININE 1.09 1.08  GLUCOSE 158* 149*  CALCIUM 8.9 8.9    Recent Labs  01/07/16 1655  INR 1.09    Neurologically intact  Assessment/Plan:   Procedure(s) (LRB): Right Hip Hemiarthroplasty (Right) Plan: surgery today 5 PM hemiarthroplasty . Should be able to get up tomorrow with therapy and walker.   Lynnlee Revels C 01/08/2016, 8:09 AM

## 2016-01-08 NOTE — Interval H&P Note (Signed)
History and Physical Interval Note:  01/08/2016 2:34 PM  Nicholas Andrews  has presented today for surgery, with the diagnosis of Right Femoral Neck Fracture  The various methods of treatment have been discussed with the patient and family. After consideration of risks, benefits and other options for treatment, the patient has consented to  Procedure(s): Right Hip Hemiarthroplasty (Right) as a surgical intervention .  The patient's history has been reviewed, patient examined, no change in status, stable for surgery.  I have reviewed the patient's chart and labs.  Questions were answered to the patient's satisfaction.     [name]

## 2016-01-08 NOTE — Interval H&P Note (Signed)
History and Physical Interval Note:  01/08/2016 4:47 PM  Nicholas Andrews  has presented today for surgery, with the diagnosis of Right Femoral Neck Fracture  The various methods of treatment have been discussed with the patient and family. After consideration of risks, benefits and other options for treatment, the patient has consented to  Procedure(s): Right Hip Hemiarthroplasty (Right) as a surgical intervention .  The patient's history has been reviewed, patient examined, no change in status, stable for surgery.  I have reviewed the patient's chart and labs.  Questions were answered to the patient's satisfaction.     Malyk Girouard C

## 2016-01-08 NOTE — Progress Notes (Addendum)
PROGRESS NOTE  [name] [id number] DOB: 1918-06-10 DOA: 01/07/2016 PCP: [name], MD  HPI: Nicholas Andrews Nicholas Andrews is a 82 y.o. male with PMH of chronic kidney disease-III, Parkinson's syndrome, cataract, history of tongue cancer, BPH, poor hearing, who presented with right hip pain after fall.  Subjective / 24 H Interval events - The patient reports right hip pain but denies chest pain, dyspnea, abdominal pain or headache.  Assessment/Plan: Principal Problem:   Fracture of femoral neck, right, closed Active Problems:   BPH (benign prostatic hyperplasia)   Hyperkalemia   Acute renal failure superimposed on stage 3 chronic kidney disease (HCC)   Parkinson's syndrome (HCC)   Fracture of femoral neck-right due to mechanical fall - Patient has moderate pain at present. - Planned right hip hemiarthroplasty today per Ortho - RCRI is 0.4%, patient at low peri op risk. He is independent at home, no prior history of heart disease, no prior MIs, no DOE and no chest pain with daily activities. - Continue Pain control: morphine prn and Norco - Post-op DVT ppx and PT orders as per Ortho. May need SNF  Hyperkalemia - likely due to worsening renal function, no EKG changes - Down to 4.6 after 1g Calcium chloride and 30g Kayexalate  - Continue to monitor, repeat BMP tomorrow  - Continue IVFs  AKI on CKD-III - Baseline Cre is 1.26 on 04/07/15, his Cre is 1.33 and BUN 27on admission, now improved to 1.08  - Continue IVFs - Follow up renal function by BMP - Holding naproxen  Parkinson's syndrome (HCC) - stable - Resume home Requip when taking PO    Diet: NPO for surgery Fluids: 0.9% 70mL/h DVT Prophylaxis: SCDs  Code Status: DNR Family Communication: None at bedside Disposition Plan: Pending today's surgery and post-op recovery Barriers to discharge:  Likely will require SNF upon disharge  Consultants:  Orthopedics  Procedures:  Right Hip Hemiarthroplasty planned for  today   Antibiotics  Anti-infectives    Start     Dose/Rate Route Frequency Ordered Stop   01/08/16 0930  ceFAZolin (ANCEF) IVPB 2 g/50 mL premix     2 g 100 mL/hr over 30 Minutes Intravenous  Once 01/08/16 0915         Studies  Dg Chest 1 View  01/07/2016  CLINICAL DATA:  Fall today with right hip fracture. EXAM: CHEST 1 VIEW COMPARISON:  None. FINDINGS: Lung volumes are low leading to crowding of bronchovascular structures. Pulmonary vasculature is difficult to assess due to low lung volumes and portable technique. Mild peribronchial thickening. There is elevation of the left hemidiaphragm. The heart is normal in size. No confluent airspace disease. Minimal blunting of left costophrenic angle, suspect this is chronic. No pneumothorax. The bones are under mineralized. No acute osseous abnormalities are seen. IMPRESSION: Hypoventilatory chest with crowding of bronchovascular structures. Mild peribronchial thickening. Elevation of the left hemidiaphragm. Blunting of the left costophrenic angle, suspect this is chronic. Electronically Signed   By: [name]   On: 01/07/2016 18:09   Dg Hip Unilat With Pelvis 2-3 Views Right  01/07/2016  CLINICAL DATA:  Right hip pain and ankle pain.  Status post fall. EXAM: DG HIP (WITH OR WITHOUT PELVIS) 2-3V RIGHT COMPARISON:  None. FINDINGS: There is generalized osteopenia. There is a mildly angulated and displaced right femoral neck fracture. There is no other fracture or dislocation. There are degenerative changes of the lower lumbar spine. IMPRESSION: Mildly angulated and displaced right femoral neck fracture. Electronically Signed  By: [name]   On: 01/07/2016 16:03   Dg Femur, Min 2 Views Right  01/07/2016  CLINICAL DATA:  Fall with right hip pain.  Initial encounter. EXAM: RIGHT FEMUR 2 VIEWS COMPARISON:  None. FINDINGS: Acute right femoral neck fracture with medial impaction. The fracture is trans to basi cervical. No evidence of  intertrochanteric extension. The remainder of the femur is intact. Osteopenia. IMPRESSION: Impacted right femoral neck fracture. Electronically Signed   By: [name]   On: 01/07/2016 16:02    Objective  Filed Vitals:   01/07/16 1926 01/07/16 2100 01/07/16 2206 01/08/16 0413  BP: 153/86 152/76 166/85 156/81  Pulse: 100 89 100 105  Temp:   98.3 F (36.8 C) 98.3 F (36.8 C)  TempSrc:   Oral Oral  Resp: 18 20 18 18   Height:   6\' 2"  (1.88 m)   Weight:   92.9 kg (204 lb 12.9 oz)   SpO2: 98% 95% 96% 92%    Intake/Output Summary (Last 24 hours) at 01/08/16 1236 Last data filed at 01/08/16 0700  Gross per 24 hour  Intake   1090 ml  Output    550 ml  Net    540 ml   Filed Weights   01/07/16 2206  Weight: 92.9 kg (204 lb 12.9 oz)    Exam:  GENERAL: NAD  HEENT: no scleral icterus, PERRL. Moist mucous membranes  NECK: supple, no LAD  LUNGS: CTA biL, no wheezing, no accessory muscle use  HEART: RRR without MRG  ABDOMEN: soft, non tender  EXTREMITIES: no clubbing / cyanosis, RLE with bucks traction  PSYCHIATRIC: normal mood and affect, cooperative  SKIN: no rashes  Data Reviewed: Basic Metabolic Panel:  Recent Labs Lab 01/07/16 1655 01/07/16 2340 01/08/16 0510  NA 137 138 137  K 5.5* 5.1 4.6  CL 104 108 106  CO2 24 23 23   GLUCOSE 137* 158* 149*  BUN 27* 24* 23*  CREATININE 1.33* 1.09 1.08  CALCIUM 9.4 8.9 8.9   CBC:  Recent Labs Lab 01/07/16 1655 01/08/16 0510  WBC 8.9 7.6  NEUTROABS 7.7  --   HGB 14.7 13.2  HCT 43.2 39.1  MCV 97.1 96.8  PLT 159 156   CBG:  Recent Labs Lab 01/07/16 2115 01/08/16 0729  GLUCAP 121* 127*    Scheduled Meds: .  ceFAZolin (ANCEF) IV  2 g Intravenous Once  . insulin aspart  5 Units Subcutaneous Once  . multivitamin with minerals  1 tablet Oral Daily  . rOPINIRole  1 mg Oral QHS  . sodium chloride  3 mL Intravenous Q12H   Continuous Infusions: . sodium chloride 100 mL/hr at 01/07/16 2112     Marzetta Board, MD [facility] Pager [phone]. If 7 PM - 7 AM, please contact night-coverage at [email or web address], password [facility] 01/08/2016, 12:36 PM  LOS: 1 day

## 2016-01-08 NOTE — Op Note (Signed)
NAMEMarland Kitchen  CORIE, POUR NO.:  [id number]  MEDICAL RECORD NO.:  [id number]  LOCATION:  79                         FACILITY:  [facility]  PHYSICIAN:  Jaianna Nicoll C. Lorin Mercy, M.D.    DATE OF BIRTH:  04-18-1918  DATE OF PROCEDURE:  01/07/2016 DATE OF DISCHARGE:                              OPERATIVE REPORT   PREOPERATIVE DIAGNOSIS:  Right displaced femoral neck fracture.  POSTOPERATIVE DIAGNOSIS:  Right displaced femoral neck fracture.  PROCEDURE:  Right hip hemiarthroplasty.  SURGEON:  Lue [place] C. Lorin Mercy, M.D.  ANESTHESIA:  General plus Marcaine local 0.5% plain Marcaine.  ESTIMATED BLOOD LOSS:  Less than 100 mL.  IMPLANTS:  57 mm ball #5 stem, +5 neck length.  DESCRIPTION OF PROCEDURE:  After induction of general anesthesia, orotracheal intubation, Ancef prophylaxis, the patient placed in lateral position, standard prepping and draping with DuraPrep, impervious stockinette, split sheets, drapes, sterile skin marker, Betadine, Steri- Drape x2 was used to seal the skin.  Time-out procedure completed.  A posterior approach was made.  Charnley retractors placed.  Piriformis was tagged and cut.  Posterior capsule was opened.  Head was removed with a corkscrew.  Neck was cut a fingerbreadth above the lesser trochanter.  Preparation after trial sizing the head showed 57 was good fit.  Sequential reaming progressing up to a #5 broach was performed.  A 5 stem press-fit was inserted +5 neck length, 57 ball socket was cleaned, copious irrigation, hip was reduced, flexion to  90 degrees, internal rotation 80 degrees with good stability.  Piriformis was repaired to gluteus medius with free needle.  Tensor fascia was closed and gluteus maximus with #1 Ethibond and 0 Vicryl on the subcutaneous tissue, 2-0 Vicryl to subcutaneous tissue, skin staple closure, Marcaine infiltration, Xeroform, 4x4s, tape and knee immobilizer.  The patient tolerated the procedure well.     Rhyland Hinderliter C. Lorin Mercy,  M.D.     MCY/MEDQ  D:  01/08/2016  T:  01/08/2016  Job:  [id number]

## 2016-01-08 NOTE — Anesthesia Procedure Notes (Signed)
Procedure Name: Intubation Date/Time: 01/08/2016 5:10 PM Performed by: Noralyn Pick D Pre-anesthesia Checklist: Patient identified, Emergency Drugs available, Suction available and Patient being monitored Patient Re-evaluated:Patient Re-evaluated prior to inductionOxygen Delivery Method: Circle System Utilized Preoxygenation: Pre-oxygenation with 100% oxygen Intubation Type: IV induction Ventilation: Mask ventilation without difficulty Laryngoscope Size: Mac and 4 Grade View: Grade IV Tube type: Oral Tube size: 7.5 mm Number of attempts: 1 Airway Equipment and Method: Stylet,  Oral airway and Bougie stylet Placement Confirmation: ETT inserted through vocal cords under direct vision,  positive ETCO2 and breath sounds checked- equal and bilateral Secured at: 22 cm Tube secured with: Tape Dental Injury: Teeth and Oropharynx as per pre-operative assessment

## 2016-01-08 NOTE — NC FL2 (Signed)
[place] LEVEL OF CARE SCREENING TOOL     IDENTIFICATION  Patient Name: Nicholas Andrews Birthdate: 11-Apr-1918 Sex: male Admission Date (Current Location): 01/07/2016  [facility] and [place] Number:  Herbalist and Address:  [facility],  [place] [address] [place]      Provider Number: [id number]  Attending Physician Name and Address:  Caren Griffins, MD  Relative Name and Phone Number:       Current Level of Care: Hospital Recommended Level of Care: [place] Prior Approval Number:    Date Approved/Denied:   PASRR Number: [id number]  Discharge Plan: SNF    Current Diagnoses: Patient Active Problem List   Diagnosis Date Noted  . Fracture of femoral neck, right, closed 01/07/2016  . Hyperkalemia 01/07/2016  . Acute renal failure superimposed on stage 3 chronic kidney disease ([place]) 01/07/2016  . BPH (benign prostatic hyperplasia)   . Parkinson's syndrome ([place])   . Closed fracture of neck of right femur ([place])   . Hard of hearing 02/18/2012  . TUBULOVILLOUS ADENOMA, COLON 01/02/2010  . FECAL OCCULT BLOOD 10/25/2008  . PERSONAL HX COLONIC POLYPS 10/25/2008    Orientation RESPIRATION BLADDER Height & Weight    Self, Time, Situation, Place  Normal Incontinent, Indwelling catheter 6\' 2"  (188 cm) 204 lbs.  BEHAVIORAL SYMPTOMS/MOOD NEUROLOGICAL BOWEL NUTRITION STATUS      Continent Diet (NPO at this time - please see d/c summary upon discharge)  AMBULATORY STATUS COMMUNICATION OF NEEDS Skin   Extensive Assist Verbally Normal                       Personal Care Assistance Level of Assistance  Bathing, Feeding, Dressing Bathing Assistance: Maximum assistance Feeding assistance: Limited assistance Dressing Assistance: Limited assistance     Functional Limitations Info             [place]  PT (By licensed PT), OT (By licensed OT)     PT Frequency: 5 OT Frequency: 5             Contractures      Additional Factors Info  Code Status, Allergies Code Status Info: DNR Allergies Info: NKDA           Current Medications (01/08/2016):  This is the current hospital active medication list Current Facility-Administered Medications  Medication Dose Route Frequency Provider Last Rate Last Dose  . 0.9 %  sodium chloride infusion   Intravenous Continuous Ivor Costa, MD 100 mL/hr at 01/07/16 2112    . acetaminophen (TYLENOL) tablet 650 mg  650 mg Oral Q6H PRN Ivor Costa, MD       Or  . acetaminophen (TYLENOL) suppository 650 mg  650 mg Rectal Q6H PRN Ivor Costa, MD      . calcium carbonate (TUMS - dosed in mg elemental calcium) chewable tablet 200 mg of elemental calcium  1 tablet Oral Daily PRN Ivor Costa, MD      . ceFAZolin (ANCEF) IVPB 2 g/50 mL premix  2 g Intravenous Once Marybelle Killings, MD      . dextrose 50 % solution 25 mL  25 mL Intravenous PRN Ivor Costa, MD      . hydrALAZINE (APRESOLINE) injection 5 mg  5 mg Intravenous Q2H PRN Ivor Costa, MD      . HYDROcodone-acetaminophen (NORCO/VICODIN) 5-325 MG per tablet 1 tablet  1 tablet Oral Q4H PRN Ivor Costa, MD      .  insulin aspart (novoLOG) injection 5 Units  5 Units Subcutaneous Once Ivor Costa, MD   5 Units at 01/07/16 2115  . morphine 2 MG/ML injection 2 mg  2 mg Intravenous Q4H PRN Ivor Costa, MD   2 mg at 01/08/16 1003  . multivitamin with minerals tablet 1 tablet  1 tablet Oral Daily Ivor Costa, MD   1 tablet at 01/07/16 2155  . rOPINIRole (REQUIP) tablet 1 mg  1 mg Oral QHS Ivor Costa, MD   1 mg at 01/07/16 2233  . sodium chloride 0.9 % injection 3 mL  3 mL Intravenous Q12H Ivor Costa, MD   3 mL at 01/07/16 2156     Discharge Medications: Please see discharge summary for a list of discharge medications.  Relevant Imaging Results:  Relevant Lab Results:   Additional Information SSN: [id number]  Standley Brooking, LCSW

## 2016-01-08 NOTE — Progress Notes (Signed)
Subjective:   Procedure(s) (LRB): Right Hip Hemiarthroplasty (Right) Patient reports pain as mild.    Objective: Vital signs in last 24 hours: Temp:  [98 F (36.7 C)-98.3 F (36.8 C)] 98.3 F (36.8 C) (01/11 0413) Pulse Rate:  [72-105] 105 (01/11 0413) Resp:  [16-20] 18 (01/11 0413) BP: (140-166)/(76-86) 156/81 mmHg (01/11 0413) SpO2:  [92 %-98 %] 92 % (01/11 0413) Weight:  [92.9 kg (204 lb 12.9 oz)] 92.9 kg (204 lb 12.9 oz) (01/10 2206)  Intake/Output from previous day: 01/10 0701 - 01/11 0700 In: 1090 [I.V.:980; IV Piggyback:110] Out: 550 [Urine:550] Intake/Output this shift:     Recent Labs  01/07/16 1655 01/08/16 0510  HGB 14.7 13.2    Recent Labs  01/07/16 1655 01/08/16 0510  WBC 8.9 7.6  RBC 4.45 4.04*  HCT 43.2 39.1  PLT 159 156    Recent Labs  01/07/16 2340 01/08/16 0510  NA 138 137  K 5.1 4.6  CL 108 106  CO2 23 23  BUN 24* 23*  CREATININE 1.09 1.08  GLUCOSE 158* 149*  CALCIUM 8.9 8.9    Recent Labs  01/07/16 1655  INR 1.09    Neurologically intact  Assessment/Plan:   Procedure(s) (LRB): Right Hip Hemiarthroplasty (Right) Plan: surgery today 5 PM hemiarthroplasty . Should be able to get up tomorrow with therapy and walker.   Lille Karim C 01/08/2016, 8:09 AM

## 2016-01-08 NOTE — H&P (View-Only) (Signed)
Patient ID: BENAIAH MIGNEAULT MRN: [id number] DOB/AGE: 06/14/1918 82 y.o.  Admit date: 01/07/2016  Admission Diagnoses:  Active Problems:   * No active hospital problems. *   HPI: Patient fell earlier today while at home doing exercises. Fell backward landing on right hip.  Unable to weightbear. xrays showed a impacted right femoral neck fracture. No other injuries.    Past Medical History: Past Medical History  Diagnosis Date  . BPH (benign prostatic hyperplasia)   . H/O tongue cancer   . Cataract     Surgical History: Past Surgical History  Procedure Laterality Date  . Tonsillectomy and adenoidectomy    . Transurethral resection of prostate    . Appendectomy    . Eye surgery      Family History: No family history on file.  Social History: Social History   Social History  . Marital Status: Married    Spouse Name: N/A  . Number of Children: N/A  . Years of Education: N/A   Occupational History  . Not on file.   Social History Main Topics  . Smoking status: Former Research scientist (life sciences)  . Smokeless tobacco: Not on file  . Alcohol Use: No  . Drug Use: No  . Sexual Activity: No   Other Topics Concern  . Not on file   Social History Narrative    Allergies: Review of patient's allergies indicates no known allergies.  Medications: I have reviewed the patient's current medications.  Vital Signs: Patient Vitals for the past 24 hrs:  BP Temp Temp src Pulse Resp SpO2  01/07/16 1710 140/83 mmHg 98 F (36.7 C) Oral 73 16 94 %    Radiology: Dg Chest 1 View  01/07/2016  CLINICAL DATA:  Fall today with right hip fracture. EXAM: CHEST 1 VIEW COMPARISON:  None. FINDINGS: Lung volumes are low leading to crowding of bronchovascular structures. Pulmonary vasculature is difficult to assess due to low lung volumes and portable technique. Mild peribronchial thickening. There is elevation of the left hemidiaphragm. The heart is normal in size. No confluent airspace disease. Minimal  blunting of left costophrenic angle, suspect this is chronic. No pneumothorax. The bones are under mineralized. No acute osseous abnormalities are seen. IMPRESSION: Hypoventilatory chest with crowding of bronchovascular structures. Mild peribronchial thickening. Elevation of the left hemidiaphragm. Blunting of the left costophrenic angle, suspect this is chronic. Electronically Signed   By: [name]   On: 01/07/2016 18:09   Dg Hip Unilat With Pelvis 2-3 Views Right  01/07/2016  CLINICAL DATA:  Right hip pain and ankle pain.  Status post fall. EXAM: DG HIP (WITH OR WITHOUT PELVIS) 2-3V RIGHT COMPARISON:  None. FINDINGS: There is generalized osteopenia. There is a mildly angulated and displaced right femoral neck fracture. There is no other fracture or dislocation. There are degenerative changes of the lower lumbar spine. IMPRESSION: Mildly angulated and displaced right femoral neck fracture. Electronically Signed   By: [name]   On: 01/07/2016 16:03   Dg Femur, Min 2 Views Right  01/07/2016  CLINICAL DATA:  Fall with right hip pain.  Initial encounter. EXAM: RIGHT FEMUR 2 VIEWS COMPARISON:  None. FINDINGS: Acute right femoral neck fracture with medial impaction. The fracture is trans to basi cervical. No evidence of intertrochanteric extension. The remainder of the femur is intact. Osteopenia. IMPRESSION: Impacted right femoral neck fracture. Electronically Signed   By: [name]   On: 01/07/2016 16:02    Labs:  Recent Labs  01/07/16  1655  WBC 8.9  RBC 4.45  HCT 43.2  PLT 159    Recent Labs  01/07/16 1655  NA 137  K 5.5*  CL 104  CO2 24  BUN 27*  CREATININE 1.33*  GLUCOSE 137*  CALCIUM 9.4    Recent Labs  01/07/16 1655  INR 1.09    Review of Systems: Review of Systems  Constitutional: Negative.   HENT: Negative.   Respiratory: Negative.   Cardiovascular: Negative.   Musculoskeletal: Positive for joint pain and falls.  Psychiatric/Behavioral:  Negative.     Physical Exam: Neurologically intact Sensation intact distally Intact pulses distally Dorsiflexion/Plantar flexion intact Compartment soft Alert and oriented Right LE- positive log roll.   Assessment and Plan: Right femoral neck fracture. Discussed with patient and his daughter who is present that best treatment option is right hip hemiarthroplasty.  Surgical procedure discussed.  Medicine service will admit.  NPO after midnight. Plan for OR tomorrow afternoon if medically stable. Bed rest.  Strict NWB.  Will have ortho tech apply bucks traction.  All questions answered.

## 2016-01-08 NOTE — Anesthesia Preprocedure Evaluation (Signed)
Anesthesia Evaluation  Patient identified by MRN, date of birth, ID band Patient awake    Reviewed: Allergy & Precautions, NPO status , Patient's Chart, lab work & pertinent test results  Airway Mallampati: II  TM Distance: >3 FB Neck ROM: Full    Dental no notable dental hx.    Pulmonary neg pulmonary ROS, former smoker,    Pulmonary exam normal breath sounds clear to auscultation       Cardiovascular negative cardio ROS Normal cardiovascular exam Rhythm:Regular Rate:Normal     Neuro/Psych parkinsons syndrome negative neurological ROS  negative psych ROS   GI/Hepatic negative GI ROS, Neg liver ROS,   Endo/Other  negative endocrine ROS  Renal/GU Renal InsufficiencyRenal disease  negative genitourinary   Musculoskeletal negative musculoskeletal ROS (+)   Abdominal   Peds negative pediatric ROS (+)  Hematology negative hematology ROS (+)   Anesthesia Other Findings   Reproductive/Obstetrics negative OB ROS                             Anesthesia Physical Anesthesia Plan  ASA: III  Anesthesia Plan: General   Post-op Pain Management:    Induction: Intravenous  Airway Management Planned: Oral ETT  Additional Equipment:   Intra-op Plan:   Post-operative Plan: Extubation in OR  Informed Consent: I have reviewed the patients History and Physical, chart, labs and discussed the procedure including the risks, benefits and alternatives for the proposed anesthesia with the patient or authorized representative who has indicated his/her understanding and acceptance.   Dental advisory given  Plan Discussed with: CRNA and Surgeon  Anesthesia Plan Comments:         Anesthesia Quick Evaluation

## 2016-01-08 NOTE — Progress Notes (Signed)
PT Cancellation Note  Patient Details Name: [name] MRN: [id number] DOB: [birth year]   Cancelled Treatment:    Reason Eval/Treat Not Completed: PT screened, no needs identified, will sign off. Scheduled for surgery today. Will need new orders post op.   Claretha Cooper 01/08/2016, 6:58 AM Tresa Endo PT [phone]

## 2016-01-08 NOTE — Transfer of Care (Signed)
Immediate Anesthesia Transfer of Care Note  Patient: Nicholas Andrews  Procedure(s) Performed: Procedure(s): Right Hip Hemiarthroplasty (Right)  Patient Location: PACU  Anesthesia Type:General  Level of Consciousness: awake, alert  and oriented  Airway & Oxygen Therapy: Patient Spontanous Breathing and Patient connected to face mask oxygen  Post-op Assessment: Report given to RN and Post -op Vital signs reviewed and stable  Post vital signs: Reviewed and stable  Last Vitals:  Filed Vitals:   01/08/16 0413 01/08/16 1324  BP: 156/81 149/72  Pulse: 105 86  Temp: 36.8 C 36.8 C  Resp: 18 18    Complications: No apparent anesthesia complications

## 2016-01-08 NOTE — Brief Op Note (Signed)
01/07/2016 - 01/08/2016  6:13 PM  PATIENT:  Levi Aland  82 y.o. male  PRE-OPERATIVE DIAGNOSIS:  Right Femoral Neck Fracture  POST-OPERATIVE DIAGNOSIS:  Right Femoral Neck Fracture  PROCEDURE:  Procedure(s): Right Hip Hemiarthroplasty (Right)  SURGEON:  Surgeon(s) and Role:    * Marybelle Killings, MD - Primary  PHYSICIAN ASSISTANT:   ASSISTANTS: none   ANESTHESIA:   local and general  EBL:  Total I/O In: 703.3 [I.V.:703.3] Out: 600 [Urine:600]  BLOOD ADMINISTERED:none  DRAINS: none   LOCAL MEDICATIONS USED:  MARCAINE     SPECIMEN:  No Specimen  DISPOSITION OF SPECIMEN:  N/A  COUNTS:  YES  TOURNIQUET:  * No tourniquets in log *  DICTATION: .Other Dictation: Dictation Number [id number]  PLAN OF CARE: already inpt  PATIENT DISPOSITION:  PACU - hemodynamically stable.   Delay start of Pharmacological VTE agent (>24hrs) due to surgical blood loss or risk of bleeding: yes

## 2016-01-09 ENCOUNTER — Encounter (HOSPITAL_COMMUNITY): Payer: Self-pay | Admitting: Orthopaedic Surgery

## 2016-01-09 LAB — CBC
HCT: 37.7 % — ABNORMAL LOW (ref 39.0–52.0)
Hemoglobin: 12.4 g/dL — ABNORMAL LOW (ref 13.0–17.0)
MCH: 32.6 pg (ref 26.0–34.0)
MCHC: 32.9 g/dL (ref 30.0–36.0)
MCV: 99.2 fL (ref 78.0–100.0)
PLATELETS: 138 10*3/uL — AB (ref 150–400)
RBC: 3.8 MIL/uL — AB (ref 4.22–5.81)
RDW: 12.7 % (ref 11.5–15.5)
WBC: 6 10*3/uL (ref 4.0–10.5)

## 2016-01-09 LAB — BASIC METABOLIC PANEL
Anion gap: 5 (ref 5–15)
BUN: 20 mg/dL (ref 6–20)
CHLORIDE: 105 mmol/L (ref 101–111)
CO2: 25 mmol/L (ref 22–32)
Calcium: 8.3 mg/dL — ABNORMAL LOW (ref 8.9–10.3)
Creatinine, Ser: 1.16 mg/dL (ref 0.61–1.24)
GFR calc non Af Amer: 52 mL/min — ABNORMAL LOW (ref >60)
GFR, EST AFRICAN AMERICAN: 60 mL/min — AB (ref >60)
GLUCOSE: 119 mg/dL — AB (ref 65–99)
Potassium: 4.6 mmol/L (ref 3.5–5.1)
SODIUM: 135 mmol/L (ref 135–145)

## 2016-01-09 LAB — GLUCOSE, CAPILLARY: Glucose-Capillary: 90 mg/dL (ref 65–99)

## 2016-01-09 MED ORDER — HYDROCODONE-ACETAMINOPHEN 5-325 MG PO TABS: 1.0000 | ORAL_TABLET | Freq: Four times a day (QID) | ORAL | Status: DC | PRN

## 2016-01-09 MED ORDER — ASPIRIN EC 325 MG PO TBEC: 325.0000 mg | DELAYED_RELEASE_TABLET | Freq: Every day | ORAL | Status: AC

## 2016-01-09 NOTE — Progress Notes (Signed)
RN contacted by CMT regarding patients pulse ox reading as abnormal range. Patient assessed with Dinamap pulse ox reading for o2 sat 98-100% ra. Will continue to monitor

## 2016-01-09 NOTE — Progress Notes (Signed)
Clear liquids given to patient this am, tolerated well. Will advance diet and continue to monitor.

## 2016-01-09 NOTE — Progress Notes (Signed)
OT Cancellation Note  Patient Details Name: COALTON RASNER MRN: [id number] DOB: 08/13/1918   Cancelled Treatment:    Noted plan for SNF- will defer OT eval to SNF [facility], [place] [address], [place] [phone] 01/09/2016, 11:04 AM

## 2016-01-09 NOTE — Progress Notes (Signed)
Subjective: Patient sleeping comfortably this morning.    Objective: Vital signs in last 24 hours: Temp:  [97.5 F (36.4 C)-99.1 F (37.3 C)] 98 F (36.7 C) (01/12 0537) Pulse Rate:  [77-98] 80 (01/12 0537) Resp:  [14-24] 22 (01/12 0537) BP: (93-149)/(51-78) 107/76 mmHg (01/12 0537) SpO2:  [91 %-100 %] 100 % (01/12 0537)  Intake/Output from previous day: 01/11 0701 - 01/12 0700 In: 1847.1 [I.V.:1847.1] Out: 1025 [Urine:1025] Intake/Output this shift: Total I/O In: 240 [P.O.:240] Out: -    Recent Labs  01/07/16 1655 01/08/16 0510 01/09/16 0500  HGB 14.7 13.2 12.4*    Recent Labs  01/08/16 0510 01/09/16 0500  WBC 7.6 6.0  RBC 4.04* 3.80*  HCT 39.1 37.7*  PLT 156 138*    Recent Labs  01/08/16 0510 01/09/16 0500  NA 137 135  K 4.6 4.6  CL 106 105  CO2 23 25  BUN 23* 20  CREATININE 1.08 1.16  GLUCOSE 149* 119*  CALCIUM 8.9 8.3*    Recent Labs  01/07/16 1655  INR 1.09    Exam:  Hip dressing c/d/i.  bilat calves nontender.  Pedal pulses intact.   Assessment/Plan: Patient will need transfer to snf for rehab.  Ok to start PT.    Benjiman Core M 01/09/2016, 10:36 AM

## 2016-01-09 NOTE — Anesthesia Postprocedure Evaluation (Signed)
Anesthesia Post Note  Patient: Nicholas Andrews  Procedure(s) Performed: Procedure(s) (LRB): Right Hip Hemiarthroplasty (Right)  Patient location during evaluation: PACU Anesthesia Type: General Level of consciousness: awake and alert Pain management: pain level controlled Vital Signs Assessment: post-procedure vital signs reviewed and stable Respiratory status: spontaneous breathing, nonlabored ventilation, respiratory function stable and patient connected to nasal cannula oxygen Cardiovascular status: blood pressure returned to baseline and stable Postop Assessment: no signs of nausea or vomiting Anesthetic complications: no    Last Vitals:  Filed Vitals:   01/08/16 2000 01/09/16 0222  BP: 101/78 137/55  Pulse: 80 93  Temp: 36.4 C 37.3 C  Resp: 18 20    Last Pain:  Filed Vitals:   01/09/16 0223  PainSc: Asleep                 Rutger Salton S

## 2016-01-09 NOTE — Clinical Social Work Placement (Signed)
Patient has a bed at [facility]. Patient's daughter, Jenny Reichmann aware. Anticipating possible discharge tomorrow.     Raynaldo Opitz, [facility] Clinical Social Worker cell #: [phone]    CLINICAL SOCIAL WORK PLACEMENT  NOTE  Date:  01/09/2016  Patient Details  Name: HARLESS LUMBARD MRN: [id number] Date of Birth: [birth year]  Clinical Social Work is seeking post-discharge placement for this patient at the [place] level of care (*CSW will initial, date and re-position this form in  chart as items are completed):  Yes   Patient/family provided with [place] Work Department's list of facilities offering this level of care within the geographic area requested by the patient (or if unable, by the patient's family).  Yes   Patient/family informed of their freedom to choose among providers that offer the needed level of care, that participate in Medicare, Medicaid or managed care program needed by the patient, have an available bed and are willing to accept the patient.  Yes   Patient/family informed of [place]'s ownership interest in [facility] and [facility], as well as of the fact that they are under no obligation to receive care at these facilities.  PASRR submitted to EDS on 01/08/16     PASRR number received on 01/08/16     Existing PASRR number confirmed on       FL2 transmitted to all facilities in geographic area requested by pt/family on 01/08/16     FL2 transmitted to all facilities within larger geographic area on       Patient informed that his/her managed care company has contracts with or will negotiate with certain facilities, including the following:        Yes   Patient/family informed of bed offers received.  Patient chooses bed at [facility]     Physician recommends and patient chooses bed at      Patient to be transferred to [facility] on  .  Patient to be transferred to facility  by       Patient family notified on   of transfer.  Name of family member notified:        PHYSICIAN       Additional Comment:    _______________________________________________ Standley Brooking, LCSW 01/09/2016, 11:30 AM

## 2016-01-09 NOTE — Evaluation (Signed)
Physical Therapy Evaluation Patient Details Name: BLADIMIR YAPP MRN: [id number] DOB: Feb 09, 1918 Today's Date: 01/09/2016   History of Present Illness  83 yo male s/p R hip hemiarthroplasty 01/07/15. Hx of Parkinson's, CKD, HOH.   Clinical Impression  On eval, pt required Max-Total assist +2 for mobility-able to stand and perform stand pivot with RW with extensive assistance. Recommend ST rehab at SNF.     Follow Up Recommendations SNF    Equipment Recommendations  Rolling walker with 5" wheels    Recommendations for Other Services       Precautions / Restrictions Precautions Precautions: Fall;Posterior Hip Precaution Comments: Indicated pt has posterior precautions on white board in room. Reviewed with pt but he will require constant cueing/reminders Required Braces or Orthoses: Knee Immobilizer - Right Knee Immobilizer - Right: Other (comment) (for safety; hip precautions) Restrictions Weight Bearing Restrictions: No RLE Weight Bearing: Weight bearing as tolerated      Mobility  Bed Mobility Overal bed mobility: Needs Assistance Bed Mobility: Supine to Sit     Supine to sit: Total assist;+2 for physical assistance;+2 for safety/equipment;HOB elevated     General bed mobility comments: Assist for trunk and bil LEs. Increased time. Utilized bedpad for scooting, positioning  Transfers Overall transfer level: Needs assistance Equipment used: Rolling walker (2 wheeled) Transfers: Sit to/from [facility] Sit to Stand: Max assist;+2 physical assistance;+2 safety/equipment;From elevated surface Stand pivot transfers: Max assist;+2 physical assistance;+2 safety/equipment       General transfer comment: Assist to rise, stabilize, control descent. Multimodal cues for safety, technique, hand/LE placement. Stand pivot with RW, bed to recliner-assist to support/stabilize pt, maneuver with RW, advance R LE.   Ambulation/Gait             General Gait Details:  NT  Stairs            Wheelchair Mobility    Modified Rankin (Stroke Patients Only)       Balance                                             Pertinent Vitals/Pain Pain Assessment: Faces Faces Pain Scale: Hurts little more Pain Location: R LE with mobility Pain Descriptors / Indicators: Sore Pain Intervention(s): Monitored during session;Repositioned    Home Living Family/patient expects to be discharged to:: Skilled nursing facility   Available Help at Discharge: Available 24 hours/day;Family           Home Equipment: Kasandra Knudsen - single point      Prior Function                 Hand Dominance        Extremity/Trunk Assessment   Upper Extremity Assessment: Generalized weakness           Lower Extremity Assessment: Generalized weakness;RLE deficits/detail RLE Deficits / Details: in KI-did not remove    Cervical / Trunk Assessment: Kyphotic  Communication   Communication: HOH  Cognition Arousal/Alertness: Awake/alert Behavior During Therapy: WFL for tasks assessed/performed Overall Cognitive Status: No family/caregiver present to determine baseline cognitive functioning Area of Impairment: Orientation;Following commands;Problem solving Orientation Level: Disoriented to;Situation;Time   Memory: Decreased short-term memory Following Commands: Follows one step commands with increased time     Problem Solving: Requires tactile cues;Requires verbal cues;Difficulty sequencing;Decreased initiation;Slow processing      General Comments      Exercises  Assessment/Plan    PT Assessment Patient needs continued PT services  PT Diagnosis Difficulty walking;Abnormality of gait;Generalized weakness;Acute pain;Altered mental status   PT Problem List Decreased strength;Decreased range of motion;Decreased activity tolerance;Decreased balance;Pain;Decreased knowledge of use of DME;Decreased cognition;Decreased  mobility;Decreased coordination;Decreased knowledge of precautions  PT Treatment Interventions DME instruction;Gait training;Functional mobility training;Therapeutic activities;Patient/family education;Balance training;Therapeutic exercise   PT Goals (Current goals can be found in the Care Plan section) Acute Rehab PT Goals Patient Stated Goal: none stated PT Goal Formulation: Patient unable to participate in goal setting Time For Goal Achievement: 01/23/16 Potential to Achieve Goals: Fair    Frequency Min 3X/week   Barriers to discharge        Co-evaluation               End of Session Equipment Utilized During Treatment: Gait belt;Right knee immobilizer Activity Tolerance: Patient tolerated treatment well Patient left: in chair;with call bell/phone within reach;with chair alarm set (lift pad in place for nursing)           Time: 1100-1119 PT Time Calculation (min) (ACUTE ONLY): 19 min   Charges:   PT Evaluation $PT Eval Moderate Complexity: 1 Procedure     PT G Codes:        Weston Anna, MPT Pager: [phone]

## 2016-01-09 NOTE — Progress Notes (Signed)
PROGRESS NOTE  [name] [id number] DOB: 15-Sep-1918 DOA: 01/07/2016 PCP: [name], MD  HPI: Nicholas Andrews is a 82 y.o. male with PMH of chronic kidney disease-III, Parkinson's syndrome, cataract, history of tongue cancer, BPH, poor hearing, who presented with right hip pain after fall.  Subjective / 24 H Interval events - The patient reports some discomfort in his right hip pain but denies chest pain or dyspnea.   Assessment/Plan: Principal Problem:   Fracture of femoral neck, right, closed Active Problems:   BPH (benign prostatic hyperplasia)   Hyperkalemia   Acute renal failure superimposed on stage 3 chronic kidney disease (HCC)   Parkinson's syndrome (HCC)   POD#1 - Fracture of femoral neck-right due to mechanical fall s/p Right Hip Hemiarthroplasty - The patient tolerated the procedure well, there was minimal blood loss (estimated [id number]) and no complications. - Hgb decreased slightly to 12.4, no evidence of active bleeding, continue to monitor, no need for transfusions - Patient has moderate pain at present - Continue pain control as ordered with bowel regimen - Continue working with PT/OT - WBAT, will likely need SNF upon discharge - Post-op DVT prophylaxis with lovenox  Hyperkalemia - likely due to worsening renal function, no EKG changes - Resolved after 1g Calcium chloride and 30g Kayexalate  - Continue to monitor, repeat BMP tomorrow   AKI on CKD-III - Baseline Cre is 1.26 on 04/07/15, his Cre is 1.33 and BUN 27on admission - Increased slightly after surgery but remains within baseline range. - Follow up renal function by BMP  Parkinson's syndrome ([place]) - stable - Continue Requip  Diet: Regular Fluids: none DVT Prophylaxis: Lovenox  Code Status: Full Code Family Communication: None at bedside Disposition Plan: PT eval pending, SNF 1-2 days Barriers to discharge:  Post op monitoring  Consultants:  Orthopedics  Procedures:  Right Hip  Hemiarthroplasty 01/08/2016   Antibiotics Pre-op Ancef completed   Studies  Dg Chest 1 View  01/07/2016  CLINICAL DATA:  Fall today with right hip fracture. EXAM: CHEST 1 VIEW COMPARISON:  None. FINDINGS: Lung volumes are low leading to crowding of bronchovascular structures. Pulmonary vasculature is difficult to assess due to low lung volumes and portable technique. Mild peribronchial thickening. There is elevation of the left hemidiaphragm. The heart is normal in size. No confluent airspace disease. Minimal blunting of left costophrenic angle, suspect this is chronic. No pneumothorax. The bones are under mineralized. No acute osseous abnormalities are seen. IMPRESSION: Hypoventilatory chest with crowding of bronchovascular structures. Mild peribronchial thickening. Elevation of the left hemidiaphragm. Blunting of the left costophrenic angle, suspect this is chronic. Electronically Signed   By: [name]   On: 01/07/2016 18:09   Pelvis Portable  01/08/2016  CLINICAL DATA:  Postoperative assessment for right hip replacement EXAM: PORTABLE PELVIS 1-2 VIEWS COMPARISON:  01/07/2016 FINDINGS: Portable AP pelvis at 1907 hours shows the patient be status post left hip hemiarthroplasty. Gas in the overlying soft tissues is compatible with the immediate postoperative state. No evidence for hardware complications. Skin staples are noted laterally over the right hip. IMPRESSION: No evidence for immediate hardware complications after right hip hemiarthroplasty. Electronically Signed   By: Misty Stanley M.D.   On: 01/08/2016 19:19   Dg Hip Unilat With Pelvis 2-3 Views Right  01/07/2016  CLINICAL DATA:  Right hip pain and ankle pain.  Status post fall. EXAM: DG HIP (WITH OR WITHOUT PELVIS) 2-3V RIGHT COMPARISON:  None. FINDINGS: There is generalized osteopenia. There is  a mildly angulated and displaced right femoral neck fracture. There is no other fracture or dislocation. There are degenerative  changes of the lower lumbar spine. IMPRESSION: Mildly angulated and displaced right femoral neck fracture. Electronically Signed   By: [name]   On: 01/07/2016 16:03   Dg Femur, Min 2 Views Right  01/07/2016  CLINICAL DATA:  Fall with right hip pain.  Initial encounter. EXAM: RIGHT FEMUR 2 VIEWS COMPARISON:  None. FINDINGS: Acute right femoral neck fracture with medial impaction. The fracture is trans to basi cervical. No evidence of intertrochanteric extension. The remainder of the femur is intact. Osteopenia. IMPRESSION: Impacted right femoral neck fracture. Electronically Signed   By: [name]   On: 01/07/2016 16:02   Objective  Filed Vitals:   01/08/16 1945 01/08/16 2000 01/09/16 0222 01/09/16 0537  BP: 93/54 101/78 137/55 107/76  Pulse: 77 80 93 80  Temp: 98 F (36.7 C) 97.5 F (36.4 C) 99.1 F (37.3 C) 98 F (36.7 C)  TempSrc:   Oral Oral  Resp: 14 18 20 22   Height:      Weight:      SpO2: 98% 96% 97% 100%    Intake/Output Summary (Last 24 hours) at 01/09/16 1314 Last data filed at 01/09/16 0900  Gross per 24 hour  Intake 2087.08 ml  Output   1025 ml  Net 1062.08 ml   Filed Weights   01/07/16 2206  Weight: 92.9 kg (204 lb 12.9 oz)   Exam:  GENERAL: NAD, answers questions appropriately  HEENT: no scleral icterus, PERRL  NECK: supple  LUNGS: diminished breath sounds but no crackles or wheezing noted, no accessory muscle use  HEART: RRR without MRG  ABDOMEN: soft, non tender  EXTREMITIES: no clubbing / cyanosis, moves all 4 extremities, RLE brace in place   Data Reviewed: Basic Metabolic Panel:  Recent Labs Lab 01/07/16 1655 01/07/16 2340 01/08/16 0510 01/09/16 0500  NA 137 138 137 135  K 5.5* 5.1 4.6 4.6  CL 104 108 106 105  CO2 24 23 23 25   GLUCOSE 137* 158* 149* 119*  BUN 27* 24* 23* 20  CREATININE 1.33* 1.09 1.08 1.16  CALCIUM 9.4 8.9 8.9 8.3*   CBC:  Recent Labs Lab 01/07/16 1655 01/08/16 0510 01/09/16 0500  WBC 8.9  7.6 6.0  NEUTROABS 7.7  --   --   HGB 14.7 13.2 12.4*  HCT 43.2 39.1 37.7*  MCV 97.1 96.8 99.2  PLT 159 156 138*   CBG:  Recent Labs Lab 01/07/16 2115 01/08/16 0729 01/09/16 0750  GLUCAP 121* 127* 90    Scheduled Meds: . docusate sodium  100 mg Oral BID  . enoxaparin (LOVENOX) injection  30 mg Subcutaneous Q24H  . insulin aspart  5 Units Subcutaneous Once  . multivitamin with minerals  1 tablet Oral Daily  . rOPINIRole  1 mg Oral QHS  . sodium chloride  3 mL Intravenous Q12H   Continuous Infusions: . sodium chloride 75 mL/hr at 01/08/16 2025  . sodium chloride Stopped (01/08/16 1639)    Marzetta Board, MD [facility] Pager [phone]. If 7 PM - 7 AM, please contact night-coverage at [email or web address], password [facility] 01/09/2016, 1:14 PM  LOS: 2 days

## 2016-01-10 LAB — BASIC METABOLIC PANEL
ANION GAP: 8 (ref 5–15)
BUN: 29 mg/dL — ABNORMAL HIGH (ref 6–20)
CALCIUM: 8.5 mg/dL — AB (ref 8.9–10.3)
CO2: 22 mmol/L (ref 22–32)
Chloride: 106 mmol/L (ref 101–111)
Creatinine, Ser: 1.54 mg/dL — ABNORMAL HIGH (ref 0.61–1.24)
GFR, EST AFRICAN AMERICAN: 42 mL/min — AB (ref >60)
GFR, EST NON AFRICAN AMERICAN: 37 mL/min — AB (ref >60)
GLUCOSE: 126 mg/dL — AB (ref 65–99)
POTASSIUM: 4 mmol/L (ref 3.5–5.1)
Sodium: 136 mmol/L (ref 135–145)

## 2016-01-10 LAB — CBC
HEMATOCRIT: 34.5 % — AB (ref 39.0–52.0)
Hemoglobin: 11.8 g/dL — ABNORMAL LOW (ref 13.0–17.0)
MCH: 33.5 pg (ref 26.0–34.0)
MCHC: 34.2 g/dL (ref 30.0–36.0)
MCV: 98 fL (ref 78.0–100.0)
PLATELETS: 133 10*3/uL — AB (ref 150–400)
RBC: 3.52 MIL/uL — AB (ref 4.22–5.81)
RDW: 12.5 % (ref 11.5–15.5)
WBC: 7.3 10*3/uL (ref 4.0–10.5)

## 2016-01-10 LAB — GLUCOSE, CAPILLARY: Glucose-Capillary: 120 mg/dL — ABNORMAL HIGH (ref 65–99)

## 2016-01-10 MED ORDER — SODIUM CHLORIDE 0.9 % IV SOLN
INTRAVENOUS | Status: AC
  Administered 2016-01-10 (×2): via INTRAVENOUS

## 2016-01-10 NOTE — Progress Notes (Signed)
PROGRESS NOTE  GIROLAMO VEKSLER [id number] DOB: [birth year] DOA: 01/07/2016 PCP: [name], MD  HPI: Nicholas Andrews is a 82 y.o. male with PMH of chronic kidney disease-III, Parkinson's syndrome, cataract, history of tongue cancer, BPH, poor hearing, who presented with right hip pain after fall.  Subjective / 24 H Interval events - The patient reports some discomfort in his right hip pain but denies chest pain or dyspnea. He was sleepy this morning but answered most questions appropriately.  Assessment/Plan: Principal Problem:   Fracture of femoral neck, right, closed Active Problems:   BPH (benign prostatic hyperplasia)   Hyperkalemia   Acute renal failure superimposed on stage 3 chronic kidney disease (HCC)   Parkinson's syndrome (HCC)   POD#2 - Fracture of femoral neck-right due to mechanical fall s/p Right Hip Hemiarthroplasty - Continue pain control as ordered with bowel regimen - Continue with PT/OT - WBAT, will likely need SNF upon discharge - Post-op DVT prophylaxis with lovenox  AKI on CKD-III - Baseline Cre is 1.26 on 04/07/15 - Cr with mild increase after surgery, increase fluid rate today  - Follow up renal function by BMP  Mild Acute Anemia - Likely multifactorial due to surgical blood loss and dilutional with IVFs - No hypotension - Monitor, no need for transfusion  Hyperkalemia - likely due to worsening renal function, no EKG changes - Resolved after 1g Calcium chloride and 30g Kayexalate  - Continue to monitor, repeat BMP tomorrow   Parkinson's syndrome ([place]) - stable - Continue Requip  Diet: Regular Fluids: none DVT Prophylaxis: Lovenox  Code Status: Full Code Family Communication: d/w daughter bedside Disposition Plan: PT eval pending, SNF 1-2 days Barriers to discharge:  Post op monitoring, improvement of renal function  Consultants:  Orthopedics  Procedures:  Right Hip Hemiarthroplasty 01/08/2016   Antibiotics Pre-op Ancef  completed   Studies  Pelvis Portable  01/08/2016  CLINICAL DATA:  Postoperative assessment for right hip replacement EXAM: PORTABLE PELVIS 1-2 VIEWS COMPARISON:  01/07/2016 FINDINGS: Portable AP pelvis at 1907 hours shows the patient be status post left hip hemiarthroplasty. Gas in the overlying soft tissues is compatible with the immediate postoperative state. No evidence for hardware complications. Skin staples are noted laterally over the right hip. IMPRESSION: No evidence for immediate hardware complications after right hip hemiarthroplasty. Electronically Signed   By: Misty Stanley M.D.   On: 01/08/2016 19:19   Objective  Filed Vitals:   01/09/16 0537 01/09/16 1414 01/09/16 2215 01/10/16 0618  BP: 107/76 108/51 141/49 128/58  Pulse: 80 104 100 81  Temp: 98 F (36.7 C) 98.9 F (37.2 C) 99.4 F (37.4 C) 98.2 F (36.8 C)  TempSrc: Oral Oral Oral Oral  Resp: 22 20 22 24   Height:      Weight:      SpO2: 100% 99% 92% 93%    Intake/Output Summary (Last 24 hours) at 01/10/16 1210 Last data filed at 01/10/16 1038  Gross per 24 hour  Intake    600 ml  Output    650 ml  Net    -50 ml   Filed Weights   01/07/16 2206  Weight: 92.9 kg (204 lb 12.9 oz)   Exam:  GENERAL: NAD  HEENT: no scleral icterus, PERRL  NECK: supple  LUNGS: diminished breath sounds, CTAB, no wheezing, no accessory muscle use  HEART: RRR without MRG  ABDOMEN: soft, non tender, nondistended  EXTREMITIES: no clubbing / cyanosis, moves all 4 extremities, RLE immobilizer in place, resting tremor  noted upper extremities   Data Reviewed: Basic Metabolic Panel:  Recent Labs Lab 01/07/16 1655 01/07/16 2340 01/08/16 0510 01/09/16 0500 01/10/16 0520  NA 137 138 137 135 136  K 5.5* 5.1 4.6 4.6 4.0  CL 104 108 106 105 106  CO2 24 23 23 25 22   GLUCOSE 137* 158* 149* 119* 126*  BUN 27* 24* 23* 20 29*  CREATININE 1.33* 1.09 1.08 1.16 1.54*  CALCIUM 9.4 8.9 8.9 8.3* 8.5*   CBC:  Recent Labs Lab  01/07/16 1655 01/08/16 0510 01/09/16 0500 01/10/16 0520  WBC 8.9 7.6 6.0 7.3  NEUTROABS 7.7  --   --   --   HGB 14.7 13.2 12.4* 11.8*  HCT 43.2 39.1 37.7* 34.5*  MCV 97.1 96.8 99.2 98.0  PLT 159 156 138* 133*   CBG:  Recent Labs Lab 01/07/16 2115 01/08/16 0729 01/09/16 0750 01/10/16 0729  GLUCAP 121* 127* 90 120*    Scheduled Meds: . docusate sodium  100 mg Oral BID  . enoxaparin (LOVENOX) injection  30 mg Subcutaneous Q24H  . insulin aspart  5 Units Subcutaneous Once  . multivitamin with minerals  1 tablet Oral Daily  . rOPINIRole  1 mg Oral QHS  . sodium chloride  3 mL Intravenous Q12H   Continuous Infusions: . sodium chloride 100 mL/hr at 01/10/16 0815    Marzetta Board, MD [facility] Pager [phone]. If 7 PM - 7 AM, please contact night-coverage at [email or web address], password [facility] 01/10/2016, 12:10 PM  LOS: 3 days

## 2016-01-10 NOTE — Progress Notes (Signed)
CSW confirmed with Ivin Booty at [facility] that they would be able to take patient over the weekend if ready.   Please call weekend CSW (ph#: [phone]) to facilitate discharge.      Raynaldo Opitz, [facility] Clinical Social Worker cell #: [phone]

## 2016-01-10 NOTE — Progress Notes (Signed)
Physical Therapy Treatment Patient Details Name: FELIPE VOYTKO MRN: [id number] DOB: Dec 28, 1918 Today's Date: 01/10/2016    History of Present Illness 82 yo male s/p R hip hemiarthroplasty 01/07/15. Hx of Parkinson's, CKD, HOH.     PT Comments    Continues to require Max-Total assist +2 for mobility. Recommend SNF. Hoyer lift for [employer] with nursing.  Follow Up Recommendations  SNF     Equipment Recommendations  Rolling walker with 5" wheels    Recommendations for Other Services       Precautions / Restrictions Precautions Precautions: Fall;Posterior Hip Precaution Comments: Indicated pt has posterior precautions on white board in room. Reviewed with pt but he will require constant cueing/reminders Required Braces or Orthoses: Knee Immobilizer - Right Knee Immobilizer - Right: Other (comment) (for safety; hip precautions) Restrictions Weight Bearing Restrictions: No RLE Weight Bearing: Weight bearing as tolerated    Mobility  Bed Mobility Overal bed mobility: Needs Assistance Bed Mobility: Supine to Sit     Supine to sit: Total assist;+2 for physical assistance;+2 for safety/equipment     General bed mobility comments: Assist for trunk and bil LEs. Increased time. Utilized bedpad for scooting, positioning  Transfers Overall transfer level: Needs assistance Equipment used: Rolling walker (2 wheeled) Transfers: Sit to/from Stand Sit to Stand: Total assist;+2 physical assistance;+2 safety/equipment;From elevated surface         General transfer comment: Max assist +2 to rise from elevated bed. Total assist +2 to rise from [facility]. Multimodal cues for safety, technique, hand/LE placement, adherence to hip precautions. Pt unable to safely take steps on today so moved bed/bsc each time pt stood (x2)   Ambulation/Gait                 Stairs            Wheelchair Mobility    Modified Rankin (Stroke Patients Only)       Balance Overall balance  assessment: Needs assistance Sitting-balance support: Feet supported;Bilateral upper extremity supported Sitting balance-Leahy Scale: Fair     Standing balance support: Bilateral upper extremity supported;During functional activity Standing balance-Leahy Scale: Zero                      Cognition Arousal/Alertness: Awake/alert Behavior During Therapy: WFL for tasks assessed/performed         Memory: Decreased short-term memory Following Commands: Follows one step commands with increased time     Problem Solving: Requires tactile cues;Requires verbal cues;Difficulty sequencing      Exercises      General Comments        Pertinent Vitals/Pain Pain Assessment: Faces Faces Pain Scale: Hurts little more Pain Location: R LE with mobility Pain Descriptors / Indicators: Sore Pain Intervention(s): Monitored during session;Repositioned;RN gave pain meds during session    Home Living                      Prior Function            PT Goals (current goals can now be found in the care plan section) Progress towards PT goals: Progressing toward goals (very slowly)    Frequency  Min 3X/week    PT Plan Current plan remains appropriate    Co-evaluation             End of Session Equipment Utilized During Treatment: Gait belt;Right knee immobilizer Activity Tolerance: Patient tolerated treatment well Patient left: in bed;with call bell/phone within reach;with bed alarm  set     Time: [id number] PT Time Calculation (min) (ACUTE ONLY): 28 min  Charges:  $Therapeutic Activity: 23-37 mins                    G Codes:      Weston Anna, MPT Pager: [phone]

## 2016-01-10 NOTE — Progress Notes (Signed)
Subjective: 2 Days Post-Op Procedure(s) (LRB): Right Hip Hemiarthroplasty (Right) Patient reports pain as 3 on 0-10 scale.    Objective: Vital signs in last 24 hours: Temp:  [98.2 F (36.8 C)-99.4 F (37.4 C)] 98.2 F (36.8 C) (01/13 0618) Pulse Rate:  [81-104] 81 (01/13 0618) Resp:  [20-24] 24 (01/13 0618) BP: (108-141)/(49-58) 128/58 mmHg (01/13 0618) SpO2:  [92 %-99 %] 93 % (01/13 0618)  Intake/Output from previous day: 01/12 0701 - 01/13 0700 In: 840 [P.O.:240; I.V.:600] Out: 350 [Urine:350] Intake/Output this shift: Total I/O In: -  Out: 300 [Urine:300]   Recent Labs  01/07/16 1655 01/08/16 0510 01/09/16 0500 01/10/16 0520  HGB 14.7 13.2 12.4* 11.8*    Recent Labs  01/09/16 0500 01/10/16 0520  WBC 6.0 7.3  RBC 3.80* 3.52*  HCT 37.7* 34.5*  PLT 138* 133*    Recent Labs  01/09/16 0500 01/10/16 0520  NA 135 136  K 4.6 4.0  CL 105 106  CO2 25 22  BUN 20 29*  CREATININE 1.16 1.54*  GLUCOSE 119* 126*  CALCIUM 8.3* 8.5*    Recent Labs  01/07/16 1655  INR 1.09    Neurologically intact  Assessment/Plan: 2 Days Post-Op Procedure(s) (LRB): Right Hip Hemiarthroplasty (Right) Up with therapy Discharge to SNF office 1 to 2 wks  Elisha Cooksey C 01/10/2016, 12:21 PM

## 2016-01-10 NOTE — Discharge Summary (Signed)
Physician Discharge Summary  [name] [id number] DOB: Oct 07, 1918 DOA: 01/07/2016  PCP: [name], MD  Admit date: 01/07/2016 Discharge date: 01/10/2016  Time spent: > 30 minutes  Recommendations for Outpatient Follow-up:  1. Follow up with Dr. Lorin Mercy in 2 weeks   2. Aspirin for DVT prophylaxis per orthopedic surgery   Discharge Diagnoses:  Principal Problem:   Fracture of femoral neck, right, closed Active Problems:   BPH (benign prostatic hyperplasia)   Hyperkalemia   Acute renal failure superimposed on stage 3 chronic kidney disease ([place])   Parkinson's syndrome ([place])  Discharge Condition: stable  Diet recommendation: regular  Filed Weights   01/07/16 2206  Weight: 92.9 kg (204 lb 12.9 oz)    History of present illness:  See H&P, Labs, Consult and Test reports for all details in brief, patient is a Nicholas Andrews is a 82 y.o. male with PMH of chronic kidney disease-III, Parkinson's syndrome, cataract, history of tongue cancer, BPH, poor hearing, who presented with right hip pain after fall.  Hospital Course:  Fracture of femoral neck-right due Nicholas mechanical fall s/p Right Hip Hemiarthroplasty - The patient tolerated the procedure well, there was minimal blood loss (estimated [id number]) and no complications. CBC has remained stable post op and there was no need for blood transfusions. Patient worked well with PT and will d/c [facility] SNF. AKI on CKD-III - Baseline Cre is 1.26 on 04/07/15, his Cre is 1.33 and BUN 27on admission. His Cr increased slightly periop, improved back Nicholas baseline on discharge, Cr.1.1 Parkinson's syndrome (HCC) - stable, continue home regimen    Procedures:  Right Hip Hemiarthroplasty 01/08/2016   Consultations:  Orthopedic surgery   Discharge Exam: Filed Vitals:   01/09/16 0537 01/09/16 1414 01/09/16 2215 01/10/16 0618  BP: 107/76 108/51 141/49 128/58  Pulse: 80 104 100 81  Temp: 98 F (36.7 C) 98.9 F (37.2 C) 99.4 F (37.4 C) 98.2 F  (36.8 C)  TempSrc: Oral Oral Oral Oral  Resp: 22 20 22 24   Height:      Weight:      SpO2: 100% 99% 92% 93%    General: NAD Cardiovascular: RRR Respiratory: CTA biL  Discharge Instructions Activity:  As tolerated   Get Medicines reviewed and adjusted: Please take all your medications with you for your next visit with your Primary MD  Please request your Primary MD Nicholas go over all hospital tests and procedure/radiological results at the follow up, please ask your Primary MD Nicholas get all Hospital records sent Nicholas his/her office.  If you experience worsening of your admission symptoms, develop shortness of breath, life threatening emergency, suicidal or homicidal thoughts you must seek medical attention immediately by calling 911 or calling your MD immediately if symptoms less severe.  You must read complete instructions/literature along with all the possible adverse reactions/side effects for all the Medicines you take and that have been prescribed Nicholas you. Take any new Medicines after you have completely understood and accpet all the possible adverse reactions/side effects.   Do not drive when taking Pain medications.   Do not take more than prescribed Pain, Sleep and Anxiety Medications  Special Instructions: If you have smoked or chewed Tobacco in the last 2 yrs please stop smoking, stop any regular Alcohol and or any Recreational drug use.  Wear Seat belts while driving.  Please note  You were cared for by a hospitalist during your hospital stay. Once you are discharged, your primary care physician will handle  any further medical issues. Please note that NO REFILLS for any discharge medications will be authorized once you are discharged, as it is imperative that you return Nicholas your primary care physician (or establish a relationship with a primary care physician if you do not have one) for your aftercare needs so that they can reassess your need for medications and monitor your  lab values.    Medication List    TAKE these medications        aspirin EC 325 MG tablet  Take 1 tablet (325 mg total) by mouth daily.     HYDROcodone-acetaminophen 5-325 MG tablet  Commonly known as:  NORCO  Take 1 tablet by mouth every 6 (six) hours as needed for moderate pain.      ASK your doctor about these medications        calcium carbonate 500 MG chewable tablet  Commonly known as:  TUMS - dosed in mg elemental calcium  Chew 1 tablet by mouth daily as needed for indigestion or heartburn.     multivitamin with minerals Tabs tablet  Take 1 tablet by mouth daily.     Naproxen Sod-Diphenhydramine 220-25 MG Tabs  Take 1 tablet by mouth at bedtime as needed (sleep).     rOPINIRole 1 MG tablet  Commonly known as:  REQUIP  Take 1 mg by mouth at bedtime.           Follow-up Information    Schedule an appointment as soon as possible for a visit with Marybelle Killings, MD.   Specialty:  Orthopedic Surgery   Why:  need return office visit 2 weeks postop   Contact information:   [place] [address] [phone]       The results of significant diagnostics from this hospitalization (including imaging, microbiology, ancillary and laboratory) are listed below for reference.    Significant Diagnostic Studies: Dg Chest 1 View  01/07/2016  CLINICAL DATA:  Fall today with right hip fracture. EXAM: CHEST 1 VIEW COMPARISON:  None. FINDINGS: Lung volumes are low leading Nicholas crowding of bronchovascular structures. Pulmonary vasculature is difficult Nicholas assess due Nicholas low lung volumes and portable technique. Mild peribronchial thickening. There is elevation of the left hemidiaphragm. The heart is normal in size. No confluent airspace disease. Minimal blunting of left costophrenic angle, suspect this is chronic. No pneumothorax. The bones are under mineralized. No acute osseous abnormalities are seen. IMPRESSION: Hypoventilatory chest with crowding of bronchovascular  structures. Mild peribronchial thickening. Elevation of the left hemidiaphragm. Blunting of the left costophrenic angle, suspect this is chronic. Electronically Signed   By: [name]   On: 01/07/2016 18:09   Pelvis Portable  01/08/2016  CLINICAL DATA:  Postoperative assessment for right hip replacement EXAM: PORTABLE PELVIS 1-2 VIEWS COMPARISON:  01/07/2016 FINDINGS: Portable AP pelvis at 1907 hours shows the patient be status post left hip hemiarthroplasty. Gas in the overlying soft tissues is compatible with the immediate postoperative state. No evidence for hardware complications. Skin staples are noted laterally over the right hip. IMPRESSION: No evidence for immediate hardware complications after right hip hemiarthroplasty. Electronically Signed   By: Misty Stanley M.D.   On: 01/08/2016 19:19   Dg Hip Unilat With Pelvis 2-3 Views Right  01/07/2016  CLINICAL DATA:  Right hip pain and ankle pain.  Status post fall. EXAM: DG HIP (WITH OR WITHOUT PELVIS) 2-3V RIGHT COMPARISON:  None. FINDINGS: There is generalized osteopenia. There is a mildly angulated and displaced  right femoral neck fracture. There is no other fracture or dislocation. There are degenerative changes of the lower lumbar spine. IMPRESSION: Mildly angulated and displaced right femoral neck fracture. Electronically Signed   By: [name]   On: 01/07/2016 16:03   Dg Femur, Min 2 Views Right  01/07/2016  CLINICAL DATA:  Fall with right hip pain.  Initial encounter. EXAM: RIGHT FEMUR 2 VIEWS COMPARISON:  None. FINDINGS: Acute right femoral neck fracture with medial impaction. The fracture is trans Nicholas basi cervical. No evidence of intertrochanteric extension. The remainder of the femur is intact. Osteopenia. IMPRESSION: Impacted right femoral neck fracture. Electronically Signed   By: [name]   On: 01/07/2016 16:02    Microbiology: Recent Results (from the past 240 hour(s))  Surgical pcr screen     Status: None     Collection Time: 01/08/16  1:50 PM  Result Value Ref Range Status   MRSA, PCR NEGATIVE NEGATIVE Final   Staphylococcus aureus NEGATIVE NEGATIVE Final    Comment:        The Xpert SA Assay (FDA approved for NASAL specimens in patients over 27 years of age), is one component of a comprehensive surveillance program.  Test performance has been validated by [facility] for patients greater than or equal Nicholas 80 year old. It is not intended Nicholas diagnose infection nor Nicholas guide or monitor treatment.     Labs: Basic Metabolic Panel:  Recent Labs Lab 01/07/16 1655 01/07/16 2340 01/08/16 0510 01/09/16 0500 01/10/16 0520  NA 137 138 137 135 136  K 5.5* 5.1 4.6 4.6 4.0  CL 104 108 106 105 106  CO2 24 23 23 25 22   GLUCOSE 137* 158* 149* 119* 126*  BUN 27* 24* 23* 20 29*  CREATININE 1.33* 1.09 1.08 1.16 1.54*  CALCIUM 9.4 8.9 8.9 8.3* 8.5*   CBC:  Recent Labs Lab 01/07/16 1655 01/08/16 0510 01/09/16 0500 01/10/16 0520  WBC 8.9 7.6 6.0 7.3  NEUTROABS 7.7  --   --   --   HGB 14.7 13.2 12.4* 11.8*  HCT 43.2 39.1 37.7* 34.5*  MCV 97.1 96.8 99.2 98.0  PLT 159 156 138* 133*    CBG:  Recent Labs Lab 01/07/16 2115 01/08/16 0729 01/09/16 0750 01/10/16 0729  GLUCAP 121* 127* 90 120*       Signed:  Marzetta Board MD [facility] 01/10/2016, 12:50 PM

## 2016-01-10 NOTE — Care Management Important Message (Signed)
Important Message  Patient Details  Name: IZEL DEAKINS MRN: [id number] Date of Birth: 09-20-1918   Medicare Important Message Given:  Yes    Camillo Flaming 01/10/2016, 10:54 AMImportant Message  Patient Details  Name: EDON YELINEK MRN: [id number] Date of Birth: 04-11-1918   Medicare Important Message Given:  Yes    Camillo Flaming 01/10/2016, 10:53 AM

## 2016-01-11 DIAGNOSIS — R2681 Unsteadiness on feet: Secondary | ICD-10-CM | POA: Diagnosis not present

## 2016-01-11 DIAGNOSIS — H2620 Unspecified complicated cataract: Secondary | ICD-10-CM | POA: Diagnosis not present

## 2016-01-11 DIAGNOSIS — S72001D Fracture of unspecified part of neck of right femur, subsequent encounter for closed fracture with routine healing: Secondary | ICD-10-CM

## 2016-01-11 DIAGNOSIS — F0280 Dementia in other diseases classified elsewhere without behavioral disturbance: Secondary | ICD-10-CM | POA: Diagnosis not present

## 2016-01-11 DIAGNOSIS — M25551 Pain in right hip: Secondary | ICD-10-CM | POA: Diagnosis not present

## 2016-01-11 DIAGNOSIS — E46 Unspecified protein-calorie malnutrition: Secondary | ICD-10-CM | POA: Diagnosis not present

## 2016-01-11 DIAGNOSIS — Z96641 Presence of right artificial hip joint: Secondary | ICD-10-CM | POA: Diagnosis not present

## 2016-01-11 DIAGNOSIS — R262 Difficulty in walking, not elsewhere classified: Secondary | ICD-10-CM | POA: Diagnosis not present

## 2016-01-11 DIAGNOSIS — N400 Enlarged prostate without lower urinary tract symptoms: Secondary | ICD-10-CM | POA: Diagnosis not present

## 2016-01-11 DIAGNOSIS — E875 Hyperkalemia: Secondary | ICD-10-CM | POA: Diagnosis not present

## 2016-01-11 DIAGNOSIS — N183 Chronic kidney disease, stage 3 (moderate): Secondary | ICD-10-CM | POA: Diagnosis not present

## 2016-01-11 DIAGNOSIS — N179 Acute kidney failure, unspecified: Secondary | ICD-10-CM | POA: Diagnosis not present

## 2016-01-11 DIAGNOSIS — G20 Parkinson's disease: Secondary | ICD-10-CM | POA: Diagnosis not present

## 2016-01-11 DIAGNOSIS — S42411D Displaced simple supracondylar fracture without intercondylar fracture of right humerus, subsequent encounter for fracture with routine healing: Secondary | ICD-10-CM | POA: Diagnosis not present

## 2016-01-11 DIAGNOSIS — G3183 Dementia with Lewy bodies: Secondary | ICD-10-CM | POA: Diagnosis not present

## 2016-01-11 DIAGNOSIS — M6281 Muscle weakness (generalized): Secondary | ICD-10-CM | POA: Diagnosis not present

## 2016-01-11 DIAGNOSIS — Z471 Aftercare following joint replacement surgery: Secondary | ICD-10-CM | POA: Diagnosis not present

## 2016-01-11 DIAGNOSIS — R489 Unspecified symbolic dysfunctions: Secondary | ICD-10-CM | POA: Diagnosis not present

## 2016-01-11 DIAGNOSIS — S72001A Fracture of unspecified part of neck of right femur, initial encounter for closed fracture: Secondary | ICD-10-CM | POA: Diagnosis not present

## 2016-01-11 DIAGNOSIS — D62 Acute posthemorrhagic anemia: Secondary | ICD-10-CM | POA: Diagnosis not present

## 2016-01-11 LAB — BASIC METABOLIC PANEL
Anion gap: 10 (ref 5–15)
BUN: 27 mg/dL — AB (ref 6–20)
CALCIUM: 8.1 mg/dL — AB (ref 8.9–10.3)
CO2: 23 mmol/L (ref 22–32)
CREATININE: 1.15 mg/dL (ref 0.61–1.24)
Chloride: 105 mmol/L (ref 101–111)
GFR calc Af Amer: >60 mL/min (ref >60)
GFR calc non Af Amer: 52 mL/min — ABNORMAL LOW (ref >60)
GLUCOSE: 114 mg/dL — AB (ref 65–99)
Potassium: 3.9 mmol/L (ref 3.5–5.1)
Sodium: 138 mmol/L (ref 135–145)

## 2016-01-11 LAB — CBC
HEMATOCRIT: 33.5 % — AB (ref 39.0–52.0)
Hemoglobin: 11.4 g/dL — ABNORMAL LOW (ref 13.0–17.0)
MCH: 32.9 pg (ref 26.0–34.0)
MCHC: 34 g/dL (ref 30.0–36.0)
MCV: 96.8 fL (ref 78.0–100.0)
PLATELETS: 149 10*3/uL — AB (ref 150–400)
RBC: 3.46 MIL/uL — ABNORMAL LOW (ref 4.22–5.81)
RDW: 12.4 % (ref 11.5–15.5)
WBC: 6.6 10*3/uL (ref 4.0–10.5)

## 2016-01-11 LAB — GLUCOSE, CAPILLARY: Glucose-Capillary: 93 mg/dL (ref 65–99)

## 2016-01-11 NOTE — Progress Notes (Signed)
Patient discharged to [facility] via ambulance, Patient's discharge packet prepared by CSW and given to EMS for facility. Report called to Santiago Glad Hall-LPN at facility. Patient alert and oriented, denies any distress, daughter called prior discharge. Surgical dressing Clean/dry/intact. No other wound noted other than bruises on arms. F/U care at [facility].

## 2016-01-13 ENCOUNTER — Encounter: Payer: Self-pay | Admitting: Internal Medicine

## 2016-01-13 ENCOUNTER — Non-Acute Institutional Stay (SKILLED_NURSING_FACILITY): Payer: Medicare Other | Admitting: Internal Medicine

## 2016-01-13 DIAGNOSIS — N183 Chronic kidney disease, stage 3 unspecified: Secondary | ICD-10-CM

## 2016-01-13 DIAGNOSIS — D62 Acute posthemorrhagic anemia: Secondary | ICD-10-CM | POA: Diagnosis not present

## 2016-01-13 DIAGNOSIS — S72001D Fracture of unspecified part of neck of right femur, subsequent encounter for closed fracture with routine healing: Secondary | ICD-10-CM

## 2016-01-13 DIAGNOSIS — E46 Unspecified protein-calorie malnutrition: Secondary | ICD-10-CM | POA: Diagnosis not present

## 2016-01-13 DIAGNOSIS — G3183 Dementia with Lewy bodies: Secondary | ICD-10-CM

## 2016-01-13 DIAGNOSIS — G20 Parkinson's disease: Secondary | ICD-10-CM

## 2016-01-13 DIAGNOSIS — R2681 Unsteadiness on feet: Secondary | ICD-10-CM

## 2016-01-13 DIAGNOSIS — F0280 Dementia in other diseases classified elsewhere without behavioral disturbance: Secondary | ICD-10-CM | POA: Diagnosis not present

## 2016-01-13 NOTE — Progress Notes (Signed)
Patient ID: Nicholas Andrews, male   DOB: 17-Jul-1918, 82 y.o.   MRN: [id number]       PCP: [name], MD  Code Status: DNR  No Known Allergies  Chief Complaint  Patient presents with  . New Admit To SNF    New admission     HPI:  82 y.o. patient is here for short term rehabilitation post hospital admission from 01/07/16-01/10/16 post fall with right femoral neck fracture. He underwent right hip hemiarthroplasty. He is seen in his room today. He has parkinson's syndrome, ckd stage 3, BPH and dementia. He is unable to participate in hpi and ROS.   Review of Systems: from staff Constitutional: Negative for fever  HENT: Negative for headache  Respiratory: Negative for cough, shortness of breath and wheezing.   Cardiovascular: Negative for chest pain Gastrointestinal: Negative for vomiting, abdominal pain Musculoskeletal: Negative for falls in the facility but is a high fall risk  Psychiatric/Behavioral: positive for memory loss.    Past Medical History  Diagnosis Date  . BPH (benign prostatic hyperplasia)   . H/O tongue cancer   . Cataract   . CKD (chronic kidney disease), stage III   . Parkinson's syndrome [facility])    Past Surgical History  Procedure Laterality Date  . Tonsillectomy and adenoidectomy    . Transurethral resection of prostate    . Appendectomy    . Eye surgery    . Hip arthroplasty Right 01/08/2016    Procedure: Right Hip Hemiarthroplasty;  Surgeon: Marybelle Killings, MD;  Location: [facility];  Service: Orthopedics;  Laterality: Right;   Social History:   reports that he has quit smoking. He does not have any smokeless tobacco history on file. He reports that he does not drink alcohol or use illicit drugs.  Family History  Problem Relation Age of Onset  . Dementia Mother     Medications:   Medication List       This list is accurate as of: 01/13/16  3:42 PM.  Always use your most recent med list.               aspirin EC 325 MG tablet  Take 1 tablet  (325 mg total) by mouth daily.     calcium carbonate 500 MG chewable tablet  Commonly known as:  TUMS - dosed in mg elemental calcium  Chew 1 tablet by mouth daily as needed for indigestion or heartburn.     HYDROcodone-acetaminophen 5-325 MG tablet  Commonly known as:  NORCO  Take 1 tablet by mouth every 6 (six) hours as needed for moderate pain.     multivitamin with minerals Tabs tablet  Take 1 tablet by mouth daily.     rOPINIRole 1 MG tablet  Commonly known as:  REQUIP  Take 1 mg by mouth at bedtime.         Physical Exam: Filed Vitals:   01/13/16 1038  BP: 140/75  Pulse: 90  Temp: 97.3 F (36.3 C)  TempSrc: Oral  Resp: 18  Height: 6\' 2"  (1.88 m)  Weight: 204 lb (92.534 kg)    General- elderly male, thin built, in no acute distress Head- normocephalic, atraumatic Nose- no maxillary or frontal sinus tenderness, no nasal discharge Throat- moist mucus membrane  Eyes- no pallor, no icterus, no discharge, normal conjunctiva, normal sclera Neck- no cervical lymphadenopathy Cardiovascular- normal s1,s2, palpable dorsalis pedis and radial pulses, right 1+ and left trace leg edema Respiratory- bilateral clear to auscultation, no wheeze, no  rhonchi, no crackles, no use of accessory muscles Abdomen- bowel sounds present, soft, non tender Musculoskeletal- able to move all 4 extremities, right leg ROM limited, on wheelchair Neurological- pleasantly confused, needs redirection  Skin- warm and dry Psychiatry- normal mood and affect    Labs reviewed: Basic Metabolic Panel:  Recent Labs  01/09/16 0500 01/10/16 0520 01/11/16 0504  NA 135 136 138  K 4.6 4.0 3.9  CL 105 106 105  CO2 25 22 23   GLUCOSE 119* 126* 114*  BUN 20 29* 27*  CREATININE 1.16 1.54* 1.15  CALCIUM 8.3* 8.5* 8.1*   CBC:  Recent Labs  04/07/15 2331 01/07/16 1655  01/09/16 0500 01/10/16 0520 01/11/16 0504  WBC 10.4 8.9  < > 6.0 7.3 6.6  NEUTROABS 8.8* 7.7  --   --   --   --   HGB 13.8  14.7  < > 12.4* 11.8* 11.4*  HCT 41.1 43.2  < > 37.7* 34.5* 33.5*  MCV 98.6 97.1  < > 99.2 98.0 96.8  PLT 188 159  < > 138* 133* 149*  < > = values in this interval not displayed.   Radiological Exams: Dg Chest 1 View  01/07/2016  CLINICAL DATA:  Fall today with right hip fracture. EXAM: CHEST 1 VIEW COMPARISON:  None. FINDINGS: Lung volumes are low leading to crowding of bronchovascular structures. Pulmonary vasculature is difficult to assess due to low lung volumes and portable technique. Mild peribronchial thickening. There is elevation of the left hemidiaphragm. The heart is normal in size. No confluent airspace disease. Minimal blunting of left costophrenic angle, suspect this is chronic. No pneumothorax. The bones are under mineralized. No acute osseous abnormalities are seen. IMPRESSION: Hypoventilatory chest with crowding of bronchovascular structures. Mild peribronchial thickening. Elevation of the left hemidiaphragm. Blunting of the left costophrenic angle, suspect this is chronic. Electronically Signed   By: [name]   On: 01/07/2016 18:09   Pelvis Portable  01/08/2016  CLINICAL DATA:  Postoperative assessment for right hip replacement EXAM: PORTABLE PELVIS 1-2 VIEWS COMPARISON:  01/07/2016 FINDINGS: Portable AP pelvis at 1907 hours shows the patient be status post left hip hemiarthroplasty. Gas in the overlying soft tissues is compatible with the immediate postoperative state. No evidence for hardware complications. Skin staples are noted laterally over the right hip. IMPRESSION: No evidence for immediate hardware complications after right hip hemiarthroplasty. Electronically Signed   By: Misty Stanley M.D.   On: 01/08/2016 19:19   Dg Hip Unilat With Pelvis 2-3 Views Right  01/07/2016  CLINICAL DATA:  Right hip pain and ankle pain.  Status post fall. EXAM: DG HIP (WITH OR WITHOUT PELVIS) 2-3V RIGHT COMPARISON:  None. FINDINGS: There is generalized osteopenia. There is a mildly  angulated and displaced right femoral neck fracture. There is no other fracture or dislocation. There are degenerative changes of the lower lumbar spine. IMPRESSION: Mildly angulated and displaced right femoral neck fracture. Electronically Signed   By: [name]   On: 01/07/2016 16:03   Dg Femur, Min 2 Views Right  01/07/2016  CLINICAL DATA:  Fall with right hip pain.  Initial encounter. EXAM: RIGHT FEMUR 2 VIEWS COMPARISON:  None. FINDINGS: Acute right femoral neck fracture with medial impaction. The fracture is trans to basi cervical. No evidence of intertrochanteric extension. The remainder of the femur is intact. Osteopenia. IMPRESSION: Impacted right femoral neck fracture. Electronically Signed   By: [name]   On: 01/07/2016 16:02     Assessment/Plan  Unsteady gait Post fall with right femoral neck fracture. S/p surgery. Will have patient work with PT/OT as tolerated to regain strength and restore function.  Fall precautions are in place.  Right femoral neck fracture S/p right hip hemiarthroplasty. Will have him work with physical therapy and occupational therapy team to help with gait training and muscle strengthening exercises.fall precautions. Skin care. Encourage to be out of bed. Has f/u with orthopedics. Continue norco 5-325 mg q6h prn pain. continue aspirin EC 325 mg daily for dvt prophylaxis. Continue calcium supplement. Monitor bowel regimen.  Blood loss anemia Post op, monitor cbc  ckd stage 3 Monitor bmp  Parkinson's syndrome Continue requip  Dementia Advanced, to provide assistance with ADLs. Fall precautions. Pressure ulcer prophylaxis  Protein calorie malnutrition Monitor po intake, get dietary consult. Continue MVI  Goals of care: short term rehabilitation   Labs/tests ordered: cbc, cmp  Family/ staff Communication: reviewed care plan with patient and nursing supervisor    Blanchie Serve, MD  [facility] [phone]  (Monday-Friday 8 am - 5 pm) [phone] (afterhours)

## 2016-01-20 LAB — HEPATIC FUNCTION PANEL
ALT: 15 U/L (ref 10–40)
AST: 20 U/L (ref 14–40)
Alkaline Phosphatase: 66 U/L (ref 25–125)
Bilirubin, Total: 0.6 mg/dL

## 2016-01-20 LAB — BASIC METABOLIC PANEL
BUN: 21 mg/dL (ref 4–21)
CREATININE: 1.1 mg/dL (ref 0.6–1.3)
GLUCOSE: 85 mg/dL
POTASSIUM: 4.5 mmol/L (ref 3.4–5.3)
Sodium: 140 mmol/L (ref 137–147)

## 2016-01-20 LAB — CBC AND DIFFERENTIAL
HCT: 36 % — AB (ref 41–53)
Hemoglobin: 11.4 g/dL — AB (ref 13.5–17.5)
Platelets: 382 10*3/uL (ref 150–399)
WBC: 5.2 10*3/mL

## 2016-01-21 DIAGNOSIS — S42411D Displaced simple supracondylar fracture without intercondylar fracture of right humerus, subsequent encounter for fracture with routine healing: Secondary | ICD-10-CM | POA: Diagnosis not present

## 2016-01-29 DIAGNOSIS — R489 Unspecified symbolic dysfunctions: Secondary | ICD-10-CM | POA: Diagnosis not present

## 2016-01-29 DIAGNOSIS — N183 Chronic kidney disease, stage 3 (moderate): Secondary | ICD-10-CM | POA: Diagnosis not present

## 2016-01-29 DIAGNOSIS — E875 Hyperkalemia: Secondary | ICD-10-CM | POA: Diagnosis not present

## 2016-01-29 DIAGNOSIS — H2620 Unspecified complicated cataract: Secondary | ICD-10-CM | POA: Diagnosis not present

## 2016-01-29 DIAGNOSIS — D62 Acute posthemorrhagic anemia: Secondary | ICD-10-CM | POA: Diagnosis not present

## 2016-01-29 DIAGNOSIS — G20 Parkinson's disease: Secondary | ICD-10-CM | POA: Diagnosis not present

## 2016-01-29 DIAGNOSIS — N400 Enlarged prostate without lower urinary tract symptoms: Secondary | ICD-10-CM | POA: Diagnosis not present

## 2016-01-29 DIAGNOSIS — R262 Difficulty in walking, not elsewhere classified: Secondary | ICD-10-CM | POA: Diagnosis not present

## 2016-01-29 DIAGNOSIS — E46 Unspecified protein-calorie malnutrition: Secondary | ICD-10-CM | POA: Diagnosis not present

## 2016-01-29 DIAGNOSIS — M6281 Muscle weakness (generalized): Secondary | ICD-10-CM | POA: Diagnosis not present

## 2016-01-29 DIAGNOSIS — Z96641 Presence of right artificial hip joint: Secondary | ICD-10-CM | POA: Diagnosis not present

## 2016-01-29 DIAGNOSIS — G3183 Dementia with Lewy bodies: Secondary | ICD-10-CM | POA: Diagnosis not present

## 2016-01-29 DIAGNOSIS — S72001D Fracture of unspecified part of neck of right femur, subsequent encounter for closed fracture with routine healing: Secondary | ICD-10-CM | POA: Diagnosis not present

## 2016-01-29 DIAGNOSIS — Z471 Aftercare following joint replacement surgery: Secondary | ICD-10-CM | POA: Diagnosis not present

## 2016-01-29 DIAGNOSIS — M25551 Pain in right hip: Secondary | ICD-10-CM | POA: Diagnosis not present

## 2016-01-29 DIAGNOSIS — F0280 Dementia in other diseases classified elsewhere without behavioral disturbance: Secondary | ICD-10-CM | POA: Diagnosis not present

## 2016-02-03 ENCOUNTER — Encounter: Payer: Self-pay | Admitting: Adult Health

## 2016-02-03 ENCOUNTER — Non-Acute Institutional Stay (SKILLED_NURSING_FACILITY): Payer: Medicare Other | Admitting: Adult Health

## 2016-02-03 DIAGNOSIS — E46 Unspecified protein-calorie malnutrition: Secondary | ICD-10-CM | POA: Diagnosis not present

## 2016-02-03 DIAGNOSIS — G3183 Dementia with Lewy bodies: Secondary | ICD-10-CM

## 2016-02-03 DIAGNOSIS — G20 Parkinson's disease: Secondary | ICD-10-CM | POA: Diagnosis not present

## 2016-02-03 DIAGNOSIS — D62 Acute posthemorrhagic anemia: Secondary | ICD-10-CM

## 2016-02-03 DIAGNOSIS — S72001D Fracture of unspecified part of neck of right femur, subsequent encounter for closed fracture with routine healing: Secondary | ICD-10-CM | POA: Diagnosis not present

## 2016-02-03 DIAGNOSIS — F0280 Dementia in other diseases classified elsewhere without behavioral disturbance: Secondary | ICD-10-CM

## 2016-02-03 DIAGNOSIS — N183 Chronic kidney disease, stage 3 unspecified: Secondary | ICD-10-CM

## 2016-02-03 NOTE — Progress Notes (Signed)
Patient ID: Nicholas Andrews, male   DOB: Mar 30, 1918, 82 y.o.   MRN: [id number]    DATE:  02/03/2016   MRN:  [id number]  BIRTHDAY: [birth year]  Facility:  [facility] Location:  [place] Room Number: 103-P  LEVEL OF CARE:  SNF (31)  Contact Information    Name Relation Home Work Mobile   Reginaldi,Cindy Daughter   [phone]   Demilade, Ducasse [phone]     Mayen, Alsman [id number]         Code Status History    Date Active Date Inactive Code Status Order ID Comments User Context   01/08/2016  8:12 PM 01/11/2016  3:06 PM Full Code [id number]  Marybelle Killings, MD Inpatient   01/07/2016  8:16 PM 01/08/2016  8:12 PM DNR [id number]  Ivor Costa, MD ED    Advance Directive Documentation        Most Recent Value   Type of Advance Directive  Out of facility DNR (pink MOST or yellow form)   Pre-existing out of facility DNR order (yellow form or pink MOST form)     "MOST" Form in Place?         Chief Complaint  Patient presents with  . Discharge Note    Right femoral neck fracture S/P right hip hemiarthroplasty, anemia, chronic kidney disease is stage III, Parkinson's syndrome, dementia and protein calorie malnutrition    HISTORY OF PRESENT ILLNESS:  This is a 82 year old male who is for discharge home with home health PT, OT and ST. He has been admitted to [facility] on 01/11/16 from [facility] had a fall and sustained a right femoral neck fracture for which she had right hip hemiarthroplasty on 01/08/16.   Patient was admitted to this facility for short-term rehabilitation after the patient's recent hospitalization.  Patient has completed SNF rehabilitation and therapy has cleared the patient for discharge.  PAST MEDICAL HISTORY:  Past Medical History  Diagnosis Date  . BPH (benign prostatic hyperplasia)   . H/O tongue cancer   . Cataract   . CKD (chronic kidney disease), stage III   . Parkinson's syndrome ([place])   . Unsteady gait    . Closed displaced fracture of right femoral neck with routine healing   . Acute blood loss anemia   . Protein calorie malnutrition ([place])   . Lewy body dementia      CURRENT MEDICATIONS: Reviewed  Patient's Medications  New Prescriptions   No medications on file  Previous Medications   ASPIRIN EC 325 MG TABLET    Take 1 tablet (325 mg total) by mouth daily.   CALCIUM CARBONATE (TUMS - DOSED IN MG ELEMENTAL CALCIUM) 500 MG CHEWABLE TABLET    Chew 1 tablet by mouth daily as needed for indigestion or heartburn.   HYDROCODONE-ACETAMINOPHEN (NORCO) 5-325 MG TABLET    Take 1 tablet by mouth every 6 (six) hours as needed for moderate pain.   MULTIPLE VITAMIN (MULTIVITAMIN WITH MINERALS) TABS TABLET    Take 1 tablet by mouth daily.   PROTEIN (PROCEL) POWD    Take 2 scoop by mouth 2 (two) times daily.   ROPINIROLE (REQUIP) 1 MG TABLET    Take 1 mg by mouth at bedtime.  Modified Medications   No medications on file  Discontinued Medications   No medications on file     No Known Allergies   REVIEW OF SYSTEMS:  GENERAL: no change in appetite, no fatigue, no weight changes,  no fever, chills or weakness EYES: Denies change in vision, dry eyes, eye pain, itching or discharge EARS: Denies change in hearing, ringing in ears, or earache NOSE: Denies nasal congestion or epistaxis MOUTH and THROAT: Denies oral discomfort, gingival pain or bleeding, pain from teeth or hoarseness   RESPIRATORY: no cough, SOB, DOE, wheezing, hemoptysis CARDIAC: no chest pain, edema or palpitations GI: no abdominal pain, diarrhea, constipation, heart burn, nausea or vomiting GU: Denies dysuria, frequency, hematuria, incontinence, or discharge PSYCHIATRIC: Denies feeling of depression or anxiety. No report of hallucinations, insomnia, paranoia, or agitation   PHYSICAL EXAMINATION  GENERAL APPEARANCE: Well nourished. In no acute distress. Normal body habitus SKIN:  Right hip surgical incision is healed HEAD:  Normal in size and contour. No evidence of trauma EYES: Lids open and close normally. No blepharitis, entropion or ectropion. PERRL. Conjunctivae are clear and sclerae are white. Lenses are without opacity EARS: Pinnae are normal. Patient hears normal voice tunes of the examiner MOUTH and THROAT: Lips are without lesions. Oral mucosa is moist and without lesions. Tongue is normal in shape, size, and color and without lesions NECK: supple, trachea midline, no neck masses, no thyroid tenderness, no thyromegaly LYMPHATICS: no LAN in the neck, no supraclavicular LAN RESPIRATORY: breathing is even & unlabored, BS CTAB CARDIAC: RRR, no murmur,no extra heart sounds, no edema GI: abdomen soft, normal BS, no masses, no tenderness, no hepatomegaly, no splenomegaly EXTREMITIES:  Able to move 4 extremities PSYCHIATRIC: Alert and oriented to person only. Affect and behavior are appropriate  LABS/RADIOLOGY: Labs reviewed: Basic Metabolic Panel:  Recent Labs  01/09/16 0500 01/10/16 0520 01/11/16 0504 01/20/16  NA 135 136 138 140  K 4.6 4.0 3.9 4.5  CL 105 106 105  --   CO2 25 22 23   --   GLUCOSE 119* 126* 114*  --   BUN 20 29* 27* 21  CREATININE 1.16 1.54* 1.15 1.1  CALCIUM 8.3* 8.5* 8.1*  --    Liver Function Tests:  Recent Labs  01/20/16  AST 20  ALT 15  ALKPHOS 66    CBC:  Recent Labs  04/07/15 2331 01/07/16 1655  01/09/16 0500 01/10/16 0520 01/11/16 0504 01/20/16  WBC 10.4 8.9  < > 6.0 7.3 6.6 5.2  NEUTROABS 8.8* 7.7  --   --   --   --   --   HGB 13.8 14.7  < > 12.4* 11.8* 11.4* 11.4*  HCT 41.1 43.2  < > 37.7* 34.5* 33.5* 36*  MCV 98.6 97.1  < > 99.2 98.0 96.8  --   PLT 188 159  < > 138* 133* 149* 382  < > = values in this interval not displayed.  CBG:  Recent Labs  01/09/16 0750 01/10/16 0729 01/11/16 0750  GLUCAP 90 120* 93      Dg Chest 1 View  01/07/2016  CLINICAL DATA:  Fall today with right hip fracture. EXAM: CHEST 1 VIEW COMPARISON:  None. FINDINGS:  Lung volumes are low leading to crowding of bronchovascular structures. Pulmonary vasculature is difficult to assess due to low lung volumes and portable technique. Mild peribronchial thickening. There is elevation of the left hemidiaphragm. The heart is normal in size. No confluent airspace disease. Minimal blunting of left costophrenic angle, suspect this is chronic. No pneumothorax. The bones are under mineralized. No acute osseous abnormalities are seen. IMPRESSION: Hypoventilatory chest with crowding of bronchovascular structures. Mild peribronchial thickening. Elevation of the left hemidiaphragm. Blunting of the left costophrenic angle, suspect this  is chronic. Electronically Signed   By: [name]   On: 01/07/2016 18:09   Pelvis Portable  01/08/2016  CLINICAL DATA:  Postoperative assessment for right hip replacement EXAM: PORTABLE PELVIS 1-2 VIEWS COMPARISON:  01/07/2016 FINDINGS: Portable AP pelvis at 1907 hours shows the patient be status post left hip hemiarthroplasty. Gas in the overlying soft tissues is compatible with the immediate postoperative state. No evidence for hardware complications. Skin staples are noted laterally over the right hip. IMPRESSION: No evidence for immediate hardware complications after right hip hemiarthroplasty. Electronically Signed   By: Misty Stanley M.D.   On: 01/08/2016 19:19   Dg Hip Unilat With Pelvis 2-3 Views Right  01/07/2016  CLINICAL DATA:  Right hip pain and ankle pain.  Status post fall. EXAM: DG HIP (WITH OR WITHOUT PELVIS) 2-3V RIGHT COMPARISON:  None. FINDINGS: There is generalized osteopenia. There is a mildly angulated and displaced right femoral neck fracture. There is no other fracture or dislocation. There are degenerative changes of the lower lumbar spine. IMPRESSION: Mildly angulated and displaced right femoral neck fracture. Electronically Signed   By: [name]   On: 01/07/2016 16:03   Dg Femur, Min 2 Views Right  01/07/2016   CLINICAL DATA:  Fall with right hip pain.  Initial encounter. EXAM: RIGHT FEMUR 2 VIEWS COMPARISON:  None. FINDINGS: Acute right femoral neck fracture with medial impaction. The fracture is trans to basi cervical. No evidence of intertrochanteric extension. The remainder of the femur is intact. Osteopenia. IMPRESSION: Impacted right femoral neck fracture. Electronically Signed   By: [name]   On: 01/07/2016 16:02    ASSESSMENT/PLAN:   Right femoral neck fracture S/P right hip hemiarthroplasty - for home health PT, OT and ST; continue Norco 5/325 mg 1 tab by mouth every 6 hours when necessary pain; aspirin EC 325 mg before meals 1 tab by mouth daily for DVT prophylaxis; follow-up with orthopedics   Anemia, acute blood loss - hemoglobin 11.4; stable  Chronic kidney disease, stage III - creatinine 1.09; improved  Parkinson's syndrome - continue Requip 1 mg 1 tab by mouth daily at bedtime  Dementia - advanced; continue supportive care  Protein-calorie malnutrition, severe - albumin 2.84; continue Procel 2 scoops BID     I have filled out patient's discharge paperwork and written prescriptions.  Patient will receive home health PT, OT and ST.  Total discharge time: Less than 30 minutes  Discharge time involved coordination of the discharge process with Education officer, museum, nursing staff and therapy department. Medical justification for home health services verified.    [facility], NP [employer] [phone]

## 2016-02-05 ENCOUNTER — Encounter: Payer: Self-pay | Admitting: Adult Health

## 2016-02-05 NOTE — Progress Notes (Signed)
Patient ID: Nicholas Andrews, male   DOB: 1918-07-26, 82 y.o.   MRN: [id number]  This encounter was created in error - please disregard.

## 2016-02-10 DIAGNOSIS — N183 Chronic kidney disease, stage 3 (moderate): Secondary | ICD-10-CM | POA: Diagnosis not present

## 2016-02-10 DIAGNOSIS — F0280 Dementia in other diseases classified elsewhere without behavioral disturbance: Secondary | ICD-10-CM | POA: Diagnosis not present

## 2016-02-10 DIAGNOSIS — E46 Unspecified protein-calorie malnutrition: Secondary | ICD-10-CM | POA: Diagnosis not present

## 2016-02-10 DIAGNOSIS — W19XXXD Unspecified fall, subsequent encounter: Secondary | ICD-10-CM | POA: Diagnosis not present

## 2016-02-10 DIAGNOSIS — G20 Parkinson's disease: Secondary | ICD-10-CM | POA: Diagnosis not present

## 2016-02-10 DIAGNOSIS — S72001D Fracture of unspecified part of neck of right femur, subsequent encounter for closed fracture with routine healing: Secondary | ICD-10-CM | POA: Diagnosis not present

## 2016-02-11 DIAGNOSIS — G20 Parkinson's disease: Secondary | ICD-10-CM | POA: Diagnosis not present

## 2016-02-11 DIAGNOSIS — F0280 Dementia in other diseases classified elsewhere without behavioral disturbance: Secondary | ICD-10-CM | POA: Diagnosis not present

## 2016-02-11 DIAGNOSIS — W19XXXD Unspecified fall, subsequent encounter: Secondary | ICD-10-CM | POA: Diagnosis not present

## 2016-02-11 DIAGNOSIS — N183 Chronic kidney disease, stage 3 (moderate): Secondary | ICD-10-CM | POA: Diagnosis not present

## 2016-02-11 DIAGNOSIS — S72001D Fracture of unspecified part of neck of right femur, subsequent encounter for closed fracture with routine healing: Secondary | ICD-10-CM | POA: Diagnosis not present

## 2016-02-11 DIAGNOSIS — E46 Unspecified protein-calorie malnutrition: Secondary | ICD-10-CM | POA: Diagnosis not present

## 2016-02-13 DIAGNOSIS — G20 Parkinson's disease: Secondary | ICD-10-CM | POA: Diagnosis not present

## 2016-02-13 DIAGNOSIS — E46 Unspecified protein-calorie malnutrition: Secondary | ICD-10-CM | POA: Diagnosis not present

## 2016-02-13 DIAGNOSIS — S72001D Fracture of unspecified part of neck of right femur, subsequent encounter for closed fracture with routine healing: Secondary | ICD-10-CM | POA: Diagnosis not present

## 2016-02-13 DIAGNOSIS — N183 Chronic kidney disease, stage 3 (moderate): Secondary | ICD-10-CM | POA: Diagnosis not present

## 2016-02-13 DIAGNOSIS — W19XXXD Unspecified fall, subsequent encounter: Secondary | ICD-10-CM | POA: Diagnosis not present

## 2016-02-13 DIAGNOSIS — F0280 Dementia in other diseases classified elsewhere without behavioral disturbance: Secondary | ICD-10-CM | POA: Diagnosis not present

## 2016-02-14 DIAGNOSIS — W19XXXD Unspecified fall, subsequent encounter: Secondary | ICD-10-CM | POA: Diagnosis not present

## 2016-02-14 DIAGNOSIS — F0280 Dementia in other diseases classified elsewhere without behavioral disturbance: Secondary | ICD-10-CM | POA: Diagnosis not present

## 2016-02-14 DIAGNOSIS — N183 Chronic kidney disease, stage 3 (moderate): Secondary | ICD-10-CM | POA: Diagnosis not present

## 2016-02-14 DIAGNOSIS — S72001D Fracture of unspecified part of neck of right femur, subsequent encounter for closed fracture with routine healing: Secondary | ICD-10-CM | POA: Diagnosis not present

## 2016-02-14 DIAGNOSIS — E46 Unspecified protein-calorie malnutrition: Secondary | ICD-10-CM | POA: Diagnosis not present

## 2016-02-14 DIAGNOSIS — G20 Parkinson's disease: Secondary | ICD-10-CM | POA: Diagnosis not present

## 2016-02-17 DIAGNOSIS — F0280 Dementia in other diseases classified elsewhere without behavioral disturbance: Secondary | ICD-10-CM | POA: Diagnosis not present

## 2016-02-17 DIAGNOSIS — N183 Chronic kidney disease, stage 3 (moderate): Secondary | ICD-10-CM | POA: Diagnosis not present

## 2016-02-17 DIAGNOSIS — E46 Unspecified protein-calorie malnutrition: Secondary | ICD-10-CM | POA: Diagnosis not present

## 2016-02-17 DIAGNOSIS — W19XXXD Unspecified fall, subsequent encounter: Secondary | ICD-10-CM | POA: Diagnosis not present

## 2016-02-17 DIAGNOSIS — G20 Parkinson's disease: Secondary | ICD-10-CM | POA: Diagnosis not present

## 2016-02-17 DIAGNOSIS — S72001D Fracture of unspecified part of neck of right femur, subsequent encounter for closed fracture with routine healing: Secondary | ICD-10-CM | POA: Diagnosis not present

## 2016-02-18 DIAGNOSIS — N183 Chronic kidney disease, stage 3 (moderate): Secondary | ICD-10-CM | POA: Diagnosis not present

## 2016-02-18 DIAGNOSIS — G20 Parkinson's disease: Secondary | ICD-10-CM | POA: Diagnosis not present

## 2016-02-18 DIAGNOSIS — S72001D Fracture of unspecified part of neck of right femur, subsequent encounter for closed fracture with routine healing: Secondary | ICD-10-CM | POA: Diagnosis not present

## 2016-02-18 DIAGNOSIS — F0280 Dementia in other diseases classified elsewhere without behavioral disturbance: Secondary | ICD-10-CM | POA: Diagnosis not present

## 2016-02-18 DIAGNOSIS — E46 Unspecified protein-calorie malnutrition: Secondary | ICD-10-CM | POA: Diagnosis not present

## 2016-02-18 DIAGNOSIS — W19XXXD Unspecified fall, subsequent encounter: Secondary | ICD-10-CM | POA: Diagnosis not present

## 2016-02-19 DIAGNOSIS — G20 Parkinson's disease: Secondary | ICD-10-CM | POA: Diagnosis not present

## 2016-02-19 DIAGNOSIS — N183 Chronic kidney disease, stage 3 (moderate): Secondary | ICD-10-CM | POA: Diagnosis not present

## 2016-02-19 DIAGNOSIS — S72001D Fracture of unspecified part of neck of right femur, subsequent encounter for closed fracture with routine healing: Secondary | ICD-10-CM | POA: Diagnosis not present

## 2016-02-19 DIAGNOSIS — F0280 Dementia in other diseases classified elsewhere without behavioral disturbance: Secondary | ICD-10-CM | POA: Diagnosis not present

## 2016-02-19 DIAGNOSIS — W19XXXD Unspecified fall, subsequent encounter: Secondary | ICD-10-CM | POA: Diagnosis not present

## 2016-02-19 DIAGNOSIS — E46 Unspecified protein-calorie malnutrition: Secondary | ICD-10-CM | POA: Diagnosis not present

## 2016-02-20 DIAGNOSIS — E46 Unspecified protein-calorie malnutrition: Secondary | ICD-10-CM | POA: Diagnosis not present

## 2016-02-20 DIAGNOSIS — G20 Parkinson's disease: Secondary | ICD-10-CM | POA: Diagnosis not present

## 2016-02-20 DIAGNOSIS — S72001D Fracture of unspecified part of neck of right femur, subsequent encounter for closed fracture with routine healing: Secondary | ICD-10-CM | POA: Diagnosis not present

## 2016-02-20 DIAGNOSIS — F0280 Dementia in other diseases classified elsewhere without behavioral disturbance: Secondary | ICD-10-CM | POA: Diagnosis not present

## 2016-02-20 DIAGNOSIS — W19XXXD Unspecified fall, subsequent encounter: Secondary | ICD-10-CM | POA: Diagnosis not present

## 2016-02-20 DIAGNOSIS — N183 Chronic kidney disease, stage 3 (moderate): Secondary | ICD-10-CM | POA: Diagnosis not present

## 2016-02-21 DIAGNOSIS — E46 Unspecified protein-calorie malnutrition: Secondary | ICD-10-CM | POA: Diagnosis not present

## 2016-02-21 DIAGNOSIS — F0280 Dementia in other diseases classified elsewhere without behavioral disturbance: Secondary | ICD-10-CM | POA: Diagnosis not present

## 2016-02-21 DIAGNOSIS — W19XXXD Unspecified fall, subsequent encounter: Secondary | ICD-10-CM | POA: Diagnosis not present

## 2016-02-21 DIAGNOSIS — G20 Parkinson's disease: Secondary | ICD-10-CM | POA: Diagnosis not present

## 2016-02-21 DIAGNOSIS — N183 Chronic kidney disease, stage 3 (moderate): Secondary | ICD-10-CM | POA: Diagnosis not present

## 2016-02-21 DIAGNOSIS — S72001D Fracture of unspecified part of neck of right femur, subsequent encounter for closed fracture with routine healing: Secondary | ICD-10-CM | POA: Diagnosis not present

## 2016-02-24 DIAGNOSIS — E46 Unspecified protein-calorie malnutrition: Secondary | ICD-10-CM | POA: Diagnosis not present

## 2016-02-24 DIAGNOSIS — N183 Chronic kidney disease, stage 3 (moderate): Secondary | ICD-10-CM | POA: Diagnosis not present

## 2016-02-24 DIAGNOSIS — W19XXXD Unspecified fall, subsequent encounter: Secondary | ICD-10-CM | POA: Diagnosis not present

## 2016-02-24 DIAGNOSIS — S72001D Fracture of unspecified part of neck of right femur, subsequent encounter for closed fracture with routine healing: Secondary | ICD-10-CM | POA: Diagnosis not present

## 2016-02-24 DIAGNOSIS — F0280 Dementia in other diseases classified elsewhere without behavioral disturbance: Secondary | ICD-10-CM | POA: Diagnosis not present

## 2016-02-24 DIAGNOSIS — G20 Parkinson's disease: Secondary | ICD-10-CM | POA: Diagnosis not present

## 2016-02-26 DIAGNOSIS — N183 Chronic kidney disease, stage 3 (moderate): Secondary | ICD-10-CM | POA: Diagnosis not present

## 2016-02-26 DIAGNOSIS — F0280 Dementia in other diseases classified elsewhere without behavioral disturbance: Secondary | ICD-10-CM | POA: Diagnosis not present

## 2016-02-26 DIAGNOSIS — G20 Parkinson's disease: Secondary | ICD-10-CM | POA: Diagnosis not present

## 2016-02-26 DIAGNOSIS — E46 Unspecified protein-calorie malnutrition: Secondary | ICD-10-CM | POA: Diagnosis not present

## 2016-02-26 DIAGNOSIS — S72001D Fracture of unspecified part of neck of right femur, subsequent encounter for closed fracture with routine healing: Secondary | ICD-10-CM | POA: Diagnosis not present

## 2016-02-26 DIAGNOSIS — W19XXXD Unspecified fall, subsequent encounter: Secondary | ICD-10-CM | POA: Diagnosis not present

## 2016-02-28 DIAGNOSIS — N183 Chronic kidney disease, stage 3 (moderate): Secondary | ICD-10-CM | POA: Diagnosis not present

## 2016-02-28 DIAGNOSIS — E46 Unspecified protein-calorie malnutrition: Secondary | ICD-10-CM | POA: Diagnosis not present

## 2016-02-28 DIAGNOSIS — W19XXXD Unspecified fall, subsequent encounter: Secondary | ICD-10-CM | POA: Diagnosis not present

## 2016-02-28 DIAGNOSIS — F0280 Dementia in other diseases classified elsewhere without behavioral disturbance: Secondary | ICD-10-CM | POA: Diagnosis not present

## 2016-02-28 DIAGNOSIS — G20 Parkinson's disease: Secondary | ICD-10-CM | POA: Diagnosis not present

## 2016-02-28 DIAGNOSIS — S72001D Fracture of unspecified part of neck of right femur, subsequent encounter for closed fracture with routine healing: Secondary | ICD-10-CM | POA: Diagnosis not present

## 2016-03-02 DIAGNOSIS — N183 Chronic kidney disease, stage 3 (moderate): Secondary | ICD-10-CM | POA: Diagnosis not present

## 2016-03-02 DIAGNOSIS — S72001D Fracture of unspecified part of neck of right femur, subsequent encounter for closed fracture with routine healing: Secondary | ICD-10-CM | POA: Diagnosis not present

## 2016-03-02 DIAGNOSIS — E46 Unspecified protein-calorie malnutrition: Secondary | ICD-10-CM | POA: Diagnosis not present

## 2016-03-02 DIAGNOSIS — G20 Parkinson's disease: Secondary | ICD-10-CM | POA: Diagnosis not present

## 2016-03-02 DIAGNOSIS — W19XXXD Unspecified fall, subsequent encounter: Secondary | ICD-10-CM | POA: Diagnosis not present

## 2016-03-02 DIAGNOSIS — F0280 Dementia in other diseases classified elsewhere without behavioral disturbance: Secondary | ICD-10-CM | POA: Diagnosis not present

## 2016-03-04 DIAGNOSIS — F0280 Dementia in other diseases classified elsewhere without behavioral disturbance: Secondary | ICD-10-CM | POA: Diagnosis not present

## 2016-03-04 DIAGNOSIS — W19XXXD Unspecified fall, subsequent encounter: Secondary | ICD-10-CM | POA: Diagnosis not present

## 2016-03-04 DIAGNOSIS — G20 Parkinson's disease: Secondary | ICD-10-CM | POA: Diagnosis not present

## 2016-03-04 DIAGNOSIS — N183 Chronic kidney disease, stage 3 (moderate): Secondary | ICD-10-CM | POA: Diagnosis not present

## 2016-03-04 DIAGNOSIS — E46 Unspecified protein-calorie malnutrition: Secondary | ICD-10-CM | POA: Diagnosis not present

## 2016-03-04 DIAGNOSIS — S72001D Fracture of unspecified part of neck of right femur, subsequent encounter for closed fracture with routine healing: Secondary | ICD-10-CM | POA: Diagnosis not present

## 2016-03-06 DIAGNOSIS — N183 Chronic kidney disease, stage 3 (moderate): Secondary | ICD-10-CM | POA: Diagnosis not present

## 2016-03-06 DIAGNOSIS — G20 Parkinson's disease: Secondary | ICD-10-CM | POA: Diagnosis not present

## 2016-03-06 DIAGNOSIS — E46 Unspecified protein-calorie malnutrition: Secondary | ICD-10-CM | POA: Diagnosis not present

## 2016-03-06 DIAGNOSIS — S72001D Fracture of unspecified part of neck of right femur, subsequent encounter for closed fracture with routine healing: Secondary | ICD-10-CM | POA: Diagnosis not present

## 2016-03-06 DIAGNOSIS — W19XXXD Unspecified fall, subsequent encounter: Secondary | ICD-10-CM | POA: Diagnosis not present

## 2016-03-06 DIAGNOSIS — F0280 Dementia in other diseases classified elsewhere without behavioral disturbance: Secondary | ICD-10-CM | POA: Diagnosis not present

## 2016-03-09 DIAGNOSIS — W19XXXD Unspecified fall, subsequent encounter: Secondary | ICD-10-CM | POA: Diagnosis not present

## 2016-03-09 DIAGNOSIS — N183 Chronic kidney disease, stage 3 (moderate): Secondary | ICD-10-CM | POA: Diagnosis not present

## 2016-03-09 DIAGNOSIS — S72001D Fracture of unspecified part of neck of right femur, subsequent encounter for closed fracture with routine healing: Secondary | ICD-10-CM | POA: Diagnosis not present

## 2016-03-09 DIAGNOSIS — G20 Parkinson's disease: Secondary | ICD-10-CM | POA: Diagnosis not present

## 2016-03-09 DIAGNOSIS — F0280 Dementia in other diseases classified elsewhere without behavioral disturbance: Secondary | ICD-10-CM | POA: Diagnosis not present

## 2016-03-09 DIAGNOSIS — E46 Unspecified protein-calorie malnutrition: Secondary | ICD-10-CM | POA: Diagnosis not present

## 2016-03-11 DIAGNOSIS — S72001D Fracture of unspecified part of neck of right femur, subsequent encounter for closed fracture with routine healing: Secondary | ICD-10-CM | POA: Diagnosis not present

## 2016-03-11 DIAGNOSIS — G20 Parkinson's disease: Secondary | ICD-10-CM | POA: Diagnosis not present

## 2016-03-11 DIAGNOSIS — F0280 Dementia in other diseases classified elsewhere without behavioral disturbance: Secondary | ICD-10-CM | POA: Diagnosis not present

## 2016-03-11 DIAGNOSIS — E46 Unspecified protein-calorie malnutrition: Secondary | ICD-10-CM | POA: Diagnosis not present

## 2016-03-11 DIAGNOSIS — W19XXXD Unspecified fall, subsequent encounter: Secondary | ICD-10-CM | POA: Diagnosis not present

## 2016-03-11 DIAGNOSIS — N183 Chronic kidney disease, stage 3 (moderate): Secondary | ICD-10-CM | POA: Diagnosis not present

## 2016-03-16 DIAGNOSIS — F0280 Dementia in other diseases classified elsewhere without behavioral disturbance: Secondary | ICD-10-CM | POA: Diagnosis not present

## 2016-03-16 DIAGNOSIS — G20 Parkinson's disease: Secondary | ICD-10-CM | POA: Diagnosis not present

## 2016-03-16 DIAGNOSIS — E46 Unspecified protein-calorie malnutrition: Secondary | ICD-10-CM | POA: Diagnosis not present

## 2016-03-16 DIAGNOSIS — N183 Chronic kidney disease, stage 3 (moderate): Secondary | ICD-10-CM | POA: Diagnosis not present

## 2016-03-16 DIAGNOSIS — W19XXXD Unspecified fall, subsequent encounter: Secondary | ICD-10-CM | POA: Diagnosis not present

## 2016-03-16 DIAGNOSIS — S72001D Fracture of unspecified part of neck of right femur, subsequent encounter for closed fracture with routine healing: Secondary | ICD-10-CM | POA: Diagnosis not present

## 2016-03-18 DIAGNOSIS — E46 Unspecified protein-calorie malnutrition: Secondary | ICD-10-CM | POA: Diagnosis not present

## 2016-03-18 DIAGNOSIS — S72001D Fracture of unspecified part of neck of right femur, subsequent encounter for closed fracture with routine healing: Secondary | ICD-10-CM | POA: Diagnosis not present

## 2016-03-18 DIAGNOSIS — N183 Chronic kidney disease, stage 3 (moderate): Secondary | ICD-10-CM | POA: Diagnosis not present

## 2016-03-18 DIAGNOSIS — W19XXXD Unspecified fall, subsequent encounter: Secondary | ICD-10-CM | POA: Diagnosis not present

## 2016-03-18 DIAGNOSIS — F0280 Dementia in other diseases classified elsewhere without behavioral disturbance: Secondary | ICD-10-CM | POA: Diagnosis not present

## 2016-03-18 DIAGNOSIS — G20 Parkinson's disease: Secondary | ICD-10-CM | POA: Diagnosis not present

## 2016-03-19 DIAGNOSIS — G20 Parkinson's disease: Secondary | ICD-10-CM | POA: Diagnosis not present

## 2016-03-19 DIAGNOSIS — S72001D Fracture of unspecified part of neck of right femur, subsequent encounter for closed fracture with routine healing: Secondary | ICD-10-CM | POA: Diagnosis not present

## 2016-03-19 DIAGNOSIS — E46 Unspecified protein-calorie malnutrition: Secondary | ICD-10-CM | POA: Diagnosis not present

## 2016-03-19 DIAGNOSIS — N183 Chronic kidney disease, stage 3 (moderate): Secondary | ICD-10-CM | POA: Diagnosis not present

## 2016-03-19 DIAGNOSIS — F0280 Dementia in other diseases classified elsewhere without behavioral disturbance: Secondary | ICD-10-CM | POA: Diagnosis not present

## 2016-03-19 DIAGNOSIS — W19XXXD Unspecified fall, subsequent encounter: Secondary | ICD-10-CM | POA: Diagnosis not present

## 2016-03-23 DIAGNOSIS — S72001D Fracture of unspecified part of neck of right femur, subsequent encounter for closed fracture with routine healing: Secondary | ICD-10-CM | POA: Diagnosis not present

## 2016-03-23 DIAGNOSIS — W19XXXD Unspecified fall, subsequent encounter: Secondary | ICD-10-CM | POA: Diagnosis not present

## 2016-03-23 DIAGNOSIS — F0280 Dementia in other diseases classified elsewhere without behavioral disturbance: Secondary | ICD-10-CM | POA: Diagnosis not present

## 2016-03-23 DIAGNOSIS — N183 Chronic kidney disease, stage 3 (moderate): Secondary | ICD-10-CM | POA: Diagnosis not present

## 2016-03-23 DIAGNOSIS — E46 Unspecified protein-calorie malnutrition: Secondary | ICD-10-CM | POA: Diagnosis not present

## 2016-03-23 DIAGNOSIS — G20 Parkinson's disease: Secondary | ICD-10-CM | POA: Diagnosis not present

## 2016-03-24 DIAGNOSIS — E46 Unspecified protein-calorie malnutrition: Secondary | ICD-10-CM | POA: Diagnosis not present

## 2016-03-24 DIAGNOSIS — W19XXXD Unspecified fall, subsequent encounter: Secondary | ICD-10-CM | POA: Diagnosis not present

## 2016-03-24 DIAGNOSIS — G20 Parkinson's disease: Secondary | ICD-10-CM | POA: Diagnosis not present

## 2016-03-24 DIAGNOSIS — S72001D Fracture of unspecified part of neck of right femur, subsequent encounter for closed fracture with routine healing: Secondary | ICD-10-CM | POA: Diagnosis not present

## 2016-03-24 DIAGNOSIS — F0280 Dementia in other diseases classified elsewhere without behavioral disturbance: Secondary | ICD-10-CM | POA: Diagnosis not present

## 2016-03-24 DIAGNOSIS — N183 Chronic kidney disease, stage 3 (moderate): Secondary | ICD-10-CM | POA: Diagnosis not present

## 2016-03-25 DIAGNOSIS — W19XXXD Unspecified fall, subsequent encounter: Secondary | ICD-10-CM | POA: Diagnosis not present

## 2016-03-25 DIAGNOSIS — E46 Unspecified protein-calorie malnutrition: Secondary | ICD-10-CM | POA: Diagnosis not present

## 2016-03-25 DIAGNOSIS — G20 Parkinson's disease: Secondary | ICD-10-CM | POA: Diagnosis not present

## 2016-03-25 DIAGNOSIS — F0280 Dementia in other diseases classified elsewhere without behavioral disturbance: Secondary | ICD-10-CM | POA: Diagnosis not present

## 2016-03-25 DIAGNOSIS — S72001D Fracture of unspecified part of neck of right femur, subsequent encounter for closed fracture with routine healing: Secondary | ICD-10-CM | POA: Diagnosis not present

## 2016-03-25 DIAGNOSIS — N183 Chronic kidney disease, stage 3 (moderate): Secondary | ICD-10-CM | POA: Diagnosis not present

## 2016-03-26 DIAGNOSIS — N183 Chronic kidney disease, stage 3 (moderate): Secondary | ICD-10-CM | POA: Diagnosis not present

## 2016-03-26 DIAGNOSIS — F0280 Dementia in other diseases classified elsewhere without behavioral disturbance: Secondary | ICD-10-CM | POA: Diagnosis not present

## 2016-03-26 DIAGNOSIS — W19XXXD Unspecified fall, subsequent encounter: Secondary | ICD-10-CM | POA: Diagnosis not present

## 2016-03-26 DIAGNOSIS — E46 Unspecified protein-calorie malnutrition: Secondary | ICD-10-CM | POA: Diagnosis not present

## 2016-03-26 DIAGNOSIS — G20 Parkinson's disease: Secondary | ICD-10-CM | POA: Diagnosis not present

## 2016-03-26 DIAGNOSIS — S72001D Fracture of unspecified part of neck of right femur, subsequent encounter for closed fracture with routine healing: Secondary | ICD-10-CM | POA: Diagnosis not present

## 2016-03-30 DIAGNOSIS — F0280 Dementia in other diseases classified elsewhere without behavioral disturbance: Secondary | ICD-10-CM | POA: Diagnosis not present

## 2016-03-30 DIAGNOSIS — E46 Unspecified protein-calorie malnutrition: Secondary | ICD-10-CM | POA: Diagnosis not present

## 2016-03-30 DIAGNOSIS — S72001D Fracture of unspecified part of neck of right femur, subsequent encounter for closed fracture with routine healing: Secondary | ICD-10-CM | POA: Diagnosis not present

## 2016-03-30 DIAGNOSIS — N183 Chronic kidney disease, stage 3 (moderate): Secondary | ICD-10-CM | POA: Diagnosis not present

## 2016-03-30 DIAGNOSIS — W19XXXD Unspecified fall, subsequent encounter: Secondary | ICD-10-CM | POA: Diagnosis not present

## 2016-03-30 DIAGNOSIS — G20 Parkinson's disease: Secondary | ICD-10-CM | POA: Diagnosis not present

## 2016-03-31 DIAGNOSIS — N183 Chronic kidney disease, stage 3 (moderate): Secondary | ICD-10-CM | POA: Diagnosis not present

## 2016-03-31 DIAGNOSIS — W19XXXD Unspecified fall, subsequent encounter: Secondary | ICD-10-CM | POA: Diagnosis not present

## 2016-03-31 DIAGNOSIS — E46 Unspecified protein-calorie malnutrition: Secondary | ICD-10-CM | POA: Diagnosis not present

## 2016-03-31 DIAGNOSIS — S72001D Fracture of unspecified part of neck of right femur, subsequent encounter for closed fracture with routine healing: Secondary | ICD-10-CM | POA: Diagnosis not present

## 2016-03-31 DIAGNOSIS — G20 Parkinson's disease: Secondary | ICD-10-CM | POA: Diagnosis not present

## 2016-03-31 DIAGNOSIS — F0280 Dementia in other diseases classified elsewhere without behavioral disturbance: Secondary | ICD-10-CM | POA: Diagnosis not present

## 2016-04-01 DIAGNOSIS — G20 Parkinson's disease: Secondary | ICD-10-CM | POA: Diagnosis not present

## 2016-04-01 DIAGNOSIS — S72001D Fracture of unspecified part of neck of right femur, subsequent encounter for closed fracture with routine healing: Secondary | ICD-10-CM | POA: Diagnosis not present

## 2016-04-01 DIAGNOSIS — E46 Unspecified protein-calorie malnutrition: Secondary | ICD-10-CM | POA: Diagnosis not present

## 2016-04-01 DIAGNOSIS — F0280 Dementia in other diseases classified elsewhere without behavioral disturbance: Secondary | ICD-10-CM | POA: Diagnosis not present

## 2016-04-01 DIAGNOSIS — W19XXXD Unspecified fall, subsequent encounter: Secondary | ICD-10-CM | POA: Diagnosis not present

## 2016-04-01 DIAGNOSIS — N183 Chronic kidney disease, stage 3 (moderate): Secondary | ICD-10-CM | POA: Diagnosis not present

## 2016-04-02 DIAGNOSIS — E46 Unspecified protein-calorie malnutrition: Secondary | ICD-10-CM | POA: Diagnosis not present

## 2016-04-02 DIAGNOSIS — S72001D Fracture of unspecified part of neck of right femur, subsequent encounter for closed fracture with routine healing: Secondary | ICD-10-CM | POA: Diagnosis not present

## 2016-04-02 DIAGNOSIS — N183 Chronic kidney disease, stage 3 (moderate): Secondary | ICD-10-CM | POA: Diagnosis not present

## 2016-04-02 DIAGNOSIS — F0280 Dementia in other diseases classified elsewhere without behavioral disturbance: Secondary | ICD-10-CM | POA: Diagnosis not present

## 2016-04-02 DIAGNOSIS — W19XXXD Unspecified fall, subsequent encounter: Secondary | ICD-10-CM | POA: Diagnosis not present

## 2016-04-02 DIAGNOSIS — G20 Parkinson's disease: Secondary | ICD-10-CM | POA: Diagnosis not present

## 2016-04-06 DIAGNOSIS — F0280 Dementia in other diseases classified elsewhere without behavioral disturbance: Secondary | ICD-10-CM | POA: Diagnosis not present

## 2016-04-06 DIAGNOSIS — W19XXXD Unspecified fall, subsequent encounter: Secondary | ICD-10-CM | POA: Diagnosis not present

## 2016-04-06 DIAGNOSIS — G20 Parkinson's disease: Secondary | ICD-10-CM | POA: Diagnosis not present

## 2016-04-06 DIAGNOSIS — E46 Unspecified protein-calorie malnutrition: Secondary | ICD-10-CM | POA: Diagnosis not present

## 2016-04-06 DIAGNOSIS — S72001D Fracture of unspecified part of neck of right femur, subsequent encounter for closed fracture with routine healing: Secondary | ICD-10-CM | POA: Diagnosis not present

## 2016-04-06 DIAGNOSIS — N183 Chronic kidney disease, stage 3 (moderate): Secondary | ICD-10-CM | POA: Diagnosis not present

## 2016-04-08 DIAGNOSIS — F0280 Dementia in other diseases classified elsewhere without behavioral disturbance: Secondary | ICD-10-CM | POA: Diagnosis not present

## 2016-04-08 DIAGNOSIS — N183 Chronic kidney disease, stage 3 (moderate): Secondary | ICD-10-CM | POA: Diagnosis not present

## 2016-04-08 DIAGNOSIS — W19XXXD Unspecified fall, subsequent encounter: Secondary | ICD-10-CM | POA: Diagnosis not present

## 2016-04-08 DIAGNOSIS — S72001D Fracture of unspecified part of neck of right femur, subsequent encounter for closed fracture with routine healing: Secondary | ICD-10-CM | POA: Diagnosis not present

## 2016-04-08 DIAGNOSIS — E46 Unspecified protein-calorie malnutrition: Secondary | ICD-10-CM | POA: Diagnosis not present

## 2016-04-08 DIAGNOSIS — G20 Parkinson's disease: Secondary | ICD-10-CM | POA: Diagnosis not present

## 2016-04-09 DIAGNOSIS — S72001D Fracture of unspecified part of neck of right femur, subsequent encounter for closed fracture with routine healing: Secondary | ICD-10-CM | POA: Diagnosis not present

## 2016-04-09 DIAGNOSIS — N183 Chronic kidney disease, stage 3 (moderate): Secondary | ICD-10-CM | POA: Diagnosis not present

## 2016-04-09 DIAGNOSIS — G20 Parkinson's disease: Secondary | ICD-10-CM | POA: Diagnosis not present

## 2016-04-09 DIAGNOSIS — F0280 Dementia in other diseases classified elsewhere without behavioral disturbance: Secondary | ICD-10-CM | POA: Diagnosis not present

## 2016-04-09 DIAGNOSIS — W19XXXD Unspecified fall, subsequent encounter: Secondary | ICD-10-CM | POA: Diagnosis not present

## 2016-04-09 DIAGNOSIS — E46 Unspecified protein-calorie malnutrition: Secondary | ICD-10-CM | POA: Diagnosis not present

## 2016-04-10 DIAGNOSIS — S72001D Fracture of unspecified part of neck of right femur, subsequent encounter for closed fracture with routine healing: Secondary | ICD-10-CM | POA: Diagnosis not present

## 2016-04-10 DIAGNOSIS — F0280 Dementia in other diseases classified elsewhere without behavioral disturbance: Secondary | ICD-10-CM | POA: Diagnosis not present

## 2016-04-10 DIAGNOSIS — G3183 Dementia with Lewy bodies: Secondary | ICD-10-CM | POA: Diagnosis not present

## 2016-04-10 DIAGNOSIS — W19XXXD Unspecified fall, subsequent encounter: Secondary | ICD-10-CM | POA: Diagnosis not present

## 2016-04-10 DIAGNOSIS — N183 Chronic kidney disease, stage 3 (moderate): Secondary | ICD-10-CM | POA: Diagnosis not present

## 2016-04-10 DIAGNOSIS — E46 Unspecified protein-calorie malnutrition: Secondary | ICD-10-CM | POA: Diagnosis not present

## 2016-04-14 DIAGNOSIS — W19XXXD Unspecified fall, subsequent encounter: Secondary | ICD-10-CM | POA: Diagnosis not present

## 2016-04-14 DIAGNOSIS — F0280 Dementia in other diseases classified elsewhere without behavioral disturbance: Secondary | ICD-10-CM | POA: Diagnosis not present

## 2016-04-14 DIAGNOSIS — S72001D Fracture of unspecified part of neck of right femur, subsequent encounter for closed fracture with routine healing: Secondary | ICD-10-CM | POA: Diagnosis not present

## 2016-04-14 DIAGNOSIS — R350 Frequency of micturition: Secondary | ICD-10-CM | POA: Diagnosis not present

## 2016-04-14 DIAGNOSIS — G3183 Dementia with Lewy bodies: Secondary | ICD-10-CM | POA: Diagnosis not present

## 2016-04-14 DIAGNOSIS — Z6828 Body mass index (BMI) 28.0-28.9, adult: Secondary | ICD-10-CM | POA: Diagnosis not present

## 2016-04-14 DIAGNOSIS — R972 Elevated prostate specific antigen [PSA]: Secondary | ICD-10-CM | POA: Diagnosis not present

## 2016-04-14 DIAGNOSIS — G20 Parkinson's disease: Secondary | ICD-10-CM | POA: Diagnosis not present

## 2016-04-14 DIAGNOSIS — E46 Unspecified protein-calorie malnutrition: Secondary | ICD-10-CM | POA: Diagnosis not present

## 2016-04-14 DIAGNOSIS — N183 Chronic kidney disease, stage 3 (moderate): Secondary | ICD-10-CM | POA: Diagnosis not present

## 2016-04-16 DIAGNOSIS — E46 Unspecified protein-calorie malnutrition: Secondary | ICD-10-CM | POA: Diagnosis not present

## 2016-04-16 DIAGNOSIS — N183 Chronic kidney disease, stage 3 (moderate): Secondary | ICD-10-CM | POA: Diagnosis not present

## 2016-04-16 DIAGNOSIS — G3183 Dementia with Lewy bodies: Secondary | ICD-10-CM | POA: Diagnosis not present

## 2016-04-16 DIAGNOSIS — S72001D Fracture of unspecified part of neck of right femur, subsequent encounter for closed fracture with routine healing: Secondary | ICD-10-CM | POA: Diagnosis not present

## 2016-04-16 DIAGNOSIS — W19XXXD Unspecified fall, subsequent encounter: Secondary | ICD-10-CM | POA: Diagnosis not present

## 2016-04-16 DIAGNOSIS — F0280 Dementia in other diseases classified elsewhere without behavioral disturbance: Secondary | ICD-10-CM | POA: Diagnosis not present

## 2016-04-21 DIAGNOSIS — F0280 Dementia in other diseases classified elsewhere without behavioral disturbance: Secondary | ICD-10-CM | POA: Diagnosis not present

## 2016-04-21 DIAGNOSIS — G3183 Dementia with Lewy bodies: Secondary | ICD-10-CM | POA: Diagnosis not present

## 2016-04-21 DIAGNOSIS — E46 Unspecified protein-calorie malnutrition: Secondary | ICD-10-CM | POA: Diagnosis not present

## 2016-04-21 DIAGNOSIS — W19XXXD Unspecified fall, subsequent encounter: Secondary | ICD-10-CM | POA: Diagnosis not present

## 2016-04-21 DIAGNOSIS — S72001D Fracture of unspecified part of neck of right femur, subsequent encounter for closed fracture with routine healing: Secondary | ICD-10-CM | POA: Diagnosis not present

## 2016-04-21 DIAGNOSIS — N183 Chronic kidney disease, stage 3 (moderate): Secondary | ICD-10-CM | POA: Diagnosis not present

## 2016-04-23 DIAGNOSIS — G3183 Dementia with Lewy bodies: Secondary | ICD-10-CM | POA: Diagnosis not present

## 2016-04-23 DIAGNOSIS — F0280 Dementia in other diseases classified elsewhere without behavioral disturbance: Secondary | ICD-10-CM | POA: Diagnosis not present

## 2016-04-23 DIAGNOSIS — W19XXXD Unspecified fall, subsequent encounter: Secondary | ICD-10-CM | POA: Diagnosis not present

## 2016-04-23 DIAGNOSIS — E46 Unspecified protein-calorie malnutrition: Secondary | ICD-10-CM | POA: Diagnosis not present

## 2016-04-23 DIAGNOSIS — S72001D Fracture of unspecified part of neck of right femur, subsequent encounter for closed fracture with routine healing: Secondary | ICD-10-CM | POA: Diagnosis not present

## 2016-04-23 DIAGNOSIS — N183 Chronic kidney disease, stage 3 (moderate): Secondary | ICD-10-CM | POA: Diagnosis not present

## 2016-04-27 DIAGNOSIS — W19XXXD Unspecified fall, subsequent encounter: Secondary | ICD-10-CM | POA: Diagnosis not present

## 2016-04-27 DIAGNOSIS — E46 Unspecified protein-calorie malnutrition: Secondary | ICD-10-CM | POA: Diagnosis not present

## 2016-04-27 DIAGNOSIS — N183 Chronic kidney disease, stage 3 (moderate): Secondary | ICD-10-CM | POA: Diagnosis not present

## 2016-04-27 DIAGNOSIS — G3183 Dementia with Lewy bodies: Secondary | ICD-10-CM | POA: Diagnosis not present

## 2016-04-27 DIAGNOSIS — F0280 Dementia in other diseases classified elsewhere without behavioral disturbance: Secondary | ICD-10-CM | POA: Diagnosis not present

## 2016-04-27 DIAGNOSIS — S72001D Fracture of unspecified part of neck of right femur, subsequent encounter for closed fracture with routine healing: Secondary | ICD-10-CM | POA: Diagnosis not present

## 2016-04-29 DIAGNOSIS — R404 Transient alteration of awareness: Secondary | ICD-10-CM | POA: Diagnosis not present

## 2016-04-29 DIAGNOSIS — R42 Dizziness and giddiness: Secondary | ICD-10-CM | POA: Diagnosis not present

## 2016-05-05 DIAGNOSIS — F0280 Dementia in other diseases classified elsewhere without behavioral disturbance: Secondary | ICD-10-CM | POA: Diagnosis not present

## 2016-05-05 DIAGNOSIS — S72001D Fracture of unspecified part of neck of right femur, subsequent encounter for closed fracture with routine healing: Secondary | ICD-10-CM | POA: Diagnosis not present

## 2016-05-05 DIAGNOSIS — N183 Chronic kidney disease, stage 3 (moderate): Secondary | ICD-10-CM | POA: Diagnosis not present

## 2016-05-05 DIAGNOSIS — G3183 Dementia with Lewy bodies: Secondary | ICD-10-CM | POA: Diagnosis not present

## 2016-05-05 DIAGNOSIS — E46 Unspecified protein-calorie malnutrition: Secondary | ICD-10-CM | POA: Diagnosis not present

## 2016-05-05 DIAGNOSIS — W19XXXD Unspecified fall, subsequent encounter: Secondary | ICD-10-CM | POA: Diagnosis not present

## 2016-05-06 DIAGNOSIS — E46 Unspecified protein-calorie malnutrition: Secondary | ICD-10-CM | POA: Diagnosis not present

## 2016-05-06 DIAGNOSIS — S72001D Fracture of unspecified part of neck of right femur, subsequent encounter for closed fracture with routine healing: Secondary | ICD-10-CM | POA: Diagnosis not present

## 2016-05-06 DIAGNOSIS — N183 Chronic kidney disease, stage 3 (moderate): Secondary | ICD-10-CM | POA: Diagnosis not present

## 2016-05-06 DIAGNOSIS — W19XXXD Unspecified fall, subsequent encounter: Secondary | ICD-10-CM | POA: Diagnosis not present

## 2016-05-06 DIAGNOSIS — G3183 Dementia with Lewy bodies: Secondary | ICD-10-CM | POA: Diagnosis not present

## 2016-05-06 DIAGNOSIS — F0280 Dementia in other diseases classified elsewhere without behavioral disturbance: Secondary | ICD-10-CM | POA: Diagnosis not present

## 2016-05-12 DIAGNOSIS — F0280 Dementia in other diseases classified elsewhere without behavioral disturbance: Secondary | ICD-10-CM | POA: Diagnosis not present

## 2016-05-12 DIAGNOSIS — W19XXXD Unspecified fall, subsequent encounter: Secondary | ICD-10-CM | POA: Diagnosis not present

## 2016-05-12 DIAGNOSIS — E46 Unspecified protein-calorie malnutrition: Secondary | ICD-10-CM | POA: Diagnosis not present

## 2016-05-12 DIAGNOSIS — S72001D Fracture of unspecified part of neck of right femur, subsequent encounter for closed fracture with routine healing: Secondary | ICD-10-CM | POA: Diagnosis not present

## 2016-05-12 DIAGNOSIS — G3183 Dementia with Lewy bodies: Secondary | ICD-10-CM | POA: Diagnosis not present

## 2016-05-12 DIAGNOSIS — N183 Chronic kidney disease, stage 3 (moderate): Secondary | ICD-10-CM | POA: Diagnosis not present

## 2016-05-14 DIAGNOSIS — W19XXXD Unspecified fall, subsequent encounter: Secondary | ICD-10-CM | POA: Diagnosis not present

## 2016-05-14 DIAGNOSIS — S72001D Fracture of unspecified part of neck of right femur, subsequent encounter for closed fracture with routine healing: Secondary | ICD-10-CM | POA: Diagnosis not present

## 2016-05-14 DIAGNOSIS — E46 Unspecified protein-calorie malnutrition: Secondary | ICD-10-CM | POA: Diagnosis not present

## 2016-05-14 DIAGNOSIS — N183 Chronic kidney disease, stage 3 (moderate): Secondary | ICD-10-CM | POA: Diagnosis not present

## 2016-05-14 DIAGNOSIS — F0280 Dementia in other diseases classified elsewhere without behavioral disturbance: Secondary | ICD-10-CM | POA: Diagnosis not present

## 2016-05-14 DIAGNOSIS — G3183 Dementia with Lewy bodies: Secondary | ICD-10-CM | POA: Diagnosis not present

## 2016-05-18 DIAGNOSIS — F0280 Dementia in other diseases classified elsewhere without behavioral disturbance: Secondary | ICD-10-CM | POA: Diagnosis not present

## 2016-05-18 DIAGNOSIS — N183 Chronic kidney disease, stage 3 (moderate): Secondary | ICD-10-CM | POA: Diagnosis not present

## 2016-05-18 DIAGNOSIS — W19XXXD Unspecified fall, subsequent encounter: Secondary | ICD-10-CM | POA: Diagnosis not present

## 2016-05-18 DIAGNOSIS — G3183 Dementia with Lewy bodies: Secondary | ICD-10-CM | POA: Diagnosis not present

## 2016-05-18 DIAGNOSIS — S72001D Fracture of unspecified part of neck of right femur, subsequent encounter for closed fracture with routine healing: Secondary | ICD-10-CM | POA: Diagnosis not present

## 2016-05-18 DIAGNOSIS — E46 Unspecified protein-calorie malnutrition: Secondary | ICD-10-CM | POA: Diagnosis not present

## 2016-05-20 DIAGNOSIS — W19XXXD Unspecified fall, subsequent encounter: Secondary | ICD-10-CM | POA: Diagnosis not present

## 2016-05-20 DIAGNOSIS — S72001D Fracture of unspecified part of neck of right femur, subsequent encounter for closed fracture with routine healing: Secondary | ICD-10-CM | POA: Diagnosis not present

## 2016-05-20 DIAGNOSIS — F0280 Dementia in other diseases classified elsewhere without behavioral disturbance: Secondary | ICD-10-CM | POA: Diagnosis not present

## 2016-05-20 DIAGNOSIS — N183 Chronic kidney disease, stage 3 (moderate): Secondary | ICD-10-CM | POA: Diagnosis not present

## 2016-05-20 DIAGNOSIS — E46 Unspecified protein-calorie malnutrition: Secondary | ICD-10-CM | POA: Diagnosis not present

## 2016-05-20 DIAGNOSIS — G3183 Dementia with Lewy bodies: Secondary | ICD-10-CM | POA: Diagnosis not present

## 2016-05-25 DIAGNOSIS — G3183 Dementia with Lewy bodies: Secondary | ICD-10-CM | POA: Diagnosis not present

## 2016-05-25 DIAGNOSIS — S72001D Fracture of unspecified part of neck of right femur, subsequent encounter for closed fracture with routine healing: Secondary | ICD-10-CM | POA: Diagnosis not present

## 2016-05-25 DIAGNOSIS — F0280 Dementia in other diseases classified elsewhere without behavioral disturbance: Secondary | ICD-10-CM | POA: Diagnosis not present

## 2016-05-25 DIAGNOSIS — N183 Chronic kidney disease, stage 3 (moderate): Secondary | ICD-10-CM | POA: Diagnosis not present

## 2016-05-25 DIAGNOSIS — W19XXXD Unspecified fall, subsequent encounter: Secondary | ICD-10-CM | POA: Diagnosis not present

## 2016-05-25 DIAGNOSIS — E46 Unspecified protein-calorie malnutrition: Secondary | ICD-10-CM | POA: Diagnosis not present

## 2016-05-29 DIAGNOSIS — N183 Chronic kidney disease, stage 3 (moderate): Secondary | ICD-10-CM | POA: Diagnosis not present

## 2016-05-29 DIAGNOSIS — S72001D Fracture of unspecified part of neck of right femur, subsequent encounter for closed fracture with routine healing: Secondary | ICD-10-CM | POA: Diagnosis not present

## 2016-05-29 DIAGNOSIS — G3183 Dementia with Lewy bodies: Secondary | ICD-10-CM | POA: Diagnosis not present

## 2016-05-29 DIAGNOSIS — F0280 Dementia in other diseases classified elsewhere without behavioral disturbance: Secondary | ICD-10-CM | POA: Diagnosis not present

## 2016-05-29 DIAGNOSIS — E46 Unspecified protein-calorie malnutrition: Secondary | ICD-10-CM | POA: Diagnosis not present

## 2016-05-29 DIAGNOSIS — W19XXXD Unspecified fall, subsequent encounter: Secondary | ICD-10-CM | POA: Diagnosis not present

## 2016-06-01 DIAGNOSIS — N183 Chronic kidney disease, stage 3 (moderate): Secondary | ICD-10-CM | POA: Diagnosis not present

## 2016-06-01 DIAGNOSIS — G3183 Dementia with Lewy bodies: Secondary | ICD-10-CM | POA: Diagnosis not present

## 2016-06-01 DIAGNOSIS — S72001D Fracture of unspecified part of neck of right femur, subsequent encounter for closed fracture with routine healing: Secondary | ICD-10-CM | POA: Diagnosis not present

## 2016-06-01 DIAGNOSIS — W19XXXD Unspecified fall, subsequent encounter: Secondary | ICD-10-CM | POA: Diagnosis not present

## 2016-06-01 DIAGNOSIS — E46 Unspecified protein-calorie malnutrition: Secondary | ICD-10-CM | POA: Diagnosis not present

## 2016-06-01 DIAGNOSIS — F0280 Dementia in other diseases classified elsewhere without behavioral disturbance: Secondary | ICD-10-CM | POA: Diagnosis not present

## 2016-06-03 DIAGNOSIS — N183 Chronic kidney disease, stage 3 (moderate): Secondary | ICD-10-CM | POA: Diagnosis not present

## 2016-06-03 DIAGNOSIS — F0280 Dementia in other diseases classified elsewhere without behavioral disturbance: Secondary | ICD-10-CM | POA: Diagnosis not present

## 2016-06-03 DIAGNOSIS — E46 Unspecified protein-calorie malnutrition: Secondary | ICD-10-CM | POA: Diagnosis not present

## 2016-06-03 DIAGNOSIS — S72001D Fracture of unspecified part of neck of right femur, subsequent encounter for closed fracture with routine healing: Secondary | ICD-10-CM | POA: Diagnosis not present

## 2016-06-03 DIAGNOSIS — W19XXXD Unspecified fall, subsequent encounter: Secondary | ICD-10-CM | POA: Diagnosis not present

## 2016-06-03 DIAGNOSIS — G3183 Dementia with Lewy bodies: Secondary | ICD-10-CM | POA: Diagnosis not present

## 2016-06-08 DIAGNOSIS — E46 Unspecified protein-calorie malnutrition: Secondary | ICD-10-CM | POA: Diagnosis not present

## 2016-06-08 DIAGNOSIS — N183 Chronic kidney disease, stage 3 (moderate): Secondary | ICD-10-CM | POA: Diagnosis not present

## 2016-06-08 DIAGNOSIS — S72001D Fracture of unspecified part of neck of right femur, subsequent encounter for closed fracture with routine healing: Secondary | ICD-10-CM | POA: Diagnosis not present

## 2016-06-08 DIAGNOSIS — G3183 Dementia with Lewy bodies: Secondary | ICD-10-CM | POA: Diagnosis not present

## 2016-06-08 DIAGNOSIS — F0280 Dementia in other diseases classified elsewhere without behavioral disturbance: Secondary | ICD-10-CM | POA: Diagnosis not present

## 2016-06-08 DIAGNOSIS — W19XXXD Unspecified fall, subsequent encounter: Secondary | ICD-10-CM | POA: Diagnosis not present

## 2016-06-11 DIAGNOSIS — M79674 Pain in right toe(s): Secondary | ICD-10-CM | POA: Diagnosis not present

## 2016-06-11 DIAGNOSIS — M2042 Other hammer toe(s) (acquired), left foot: Secondary | ICD-10-CM | POA: Diagnosis not present

## 2016-06-11 DIAGNOSIS — M2041 Other hammer toe(s) (acquired), right foot: Secondary | ICD-10-CM | POA: Diagnosis not present

## 2016-06-29 DIAGNOSIS — N183 Chronic kidney disease, stage 3 (moderate): Secondary | ICD-10-CM | POA: Diagnosis not present

## 2016-06-29 DIAGNOSIS — I10 Essential (primary) hypertension: Secondary | ICD-10-CM | POA: Diagnosis not present

## 2016-06-29 DIAGNOSIS — Z125 Encounter for screening for malignant neoplasm of prostate: Secondary | ICD-10-CM | POA: Diagnosis not present

## 2016-06-29 DIAGNOSIS — E784 Other hyperlipidemia: Secondary | ICD-10-CM | POA: Diagnosis not present

## 2016-07-06 DIAGNOSIS — R972 Elevated prostate specific antigen [PSA]: Secondary | ICD-10-CM | POA: Diagnosis not present

## 2016-07-06 DIAGNOSIS — E784 Other hyperlipidemia: Secondary | ICD-10-CM | POA: Diagnosis not present

## 2016-07-06 DIAGNOSIS — I10 Essential (primary) hypertension: Secondary | ICD-10-CM | POA: Diagnosis not present

## 2016-07-06 DIAGNOSIS — G20 Parkinson's disease: Secondary | ICD-10-CM | POA: Diagnosis not present

## 2016-07-06 DIAGNOSIS — Z1389 Encounter for screening for other disorder: Secondary | ICD-10-CM | POA: Diagnosis not present

## 2016-07-06 DIAGNOSIS — Z0000 Encounter for general adult medical examination without abnormal findings: Secondary | ICD-10-CM | POA: Diagnosis not present

## 2016-07-06 DIAGNOSIS — N183 Chronic kidney disease, stage 3 (moderate): Secondary | ICD-10-CM | POA: Diagnosis not present

## 2016-07-06 DIAGNOSIS — Z6828 Body mass index (BMI) 28.0-28.9, adult: Secondary | ICD-10-CM | POA: Diagnosis not present

## 2016-07-06 DIAGNOSIS — I872 Venous insufficiency (chronic) (peripheral): Secondary | ICD-10-CM | POA: Diagnosis not present

## 2016-07-09 DIAGNOSIS — H903 Sensorineural hearing loss, bilateral: Secondary | ICD-10-CM | POA: Diagnosis not present

## 2016-07-09 DIAGNOSIS — J343 Hypertrophy of nasal turbinates: Secondary | ICD-10-CM | POA: Diagnosis not present

## 2016-07-09 DIAGNOSIS — H6122 Impacted cerumen, left ear: Secondary | ICD-10-CM | POA: Diagnosis not present

## 2016-08-05 DIAGNOSIS — R079 Chest pain, unspecified: Secondary | ICD-10-CM | POA: Diagnosis not present

## 2016-09-21 ENCOUNTER — Ambulatory Visit (INDEPENDENT_AMBULATORY_CARE_PROVIDER_SITE_OTHER): Payer: Medicare Other | Admitting: Physician Assistant

## 2016-09-21 DIAGNOSIS — Z23 Encounter for immunization: Secondary | ICD-10-CM

## 2016-09-21 NOTE — Progress Notes (Signed)
   09/21/2016 6:37 PM   DOB: 1918/04/23 / MRN: [id number]  SUBJECTIVE:  Nicholas Andrews is a 82 y.o. male presenting for immunizations. States he is here for a flu shot and a pneumonia shot.  He denies a history of ever receiving the pneumonia shot. His daughter reports that if he had the shot he would have gotten it here. He feels well today and denies fever, cough, SOB, chest pain, nausea.   Immunization History  Administered Date(s) Administered  . Influenza Split 09/22/2012  . Influenza,inj,Quad PF,36+ Mos 10/19/2013, 09/27/2014, 09/21/2016  . Pneumococcal Conjugate-13 09/21/2016     He has No Known Allergies.   He  has a past medical history of Acute blood loss anemia; BPH (benign prostatic hyperplasia); Cataract; CKD (chronic kidney disease), stage III; Closed displaced fracture of right femoral neck with routine healing; H/O tongue cancer; Lewy body dementia; Parkinson's syndrome ([place]); Protein calorie malnutrition ([place]); and Unsteady gait.    He  reports that he has quit smoking. He does not have any smokeless tobacco history on file. He reports that he does not drink alcohol or use drugs. He  reports that he does not engage in sexual activity. The patient  has a past surgical history that includes Tonsillectomy and adenoidectomy; Transurethral resection of prostate; Appendectomy; Eye surgery; and Hip Arthroplasty (Right, 01/08/2016).  His family history includes Dementia in his mother.  Review of Systems  Constitutional: Negative for chills and fever.  Skin: Negative for itching and rash.    The problem list and medications were reviewed and updated by myself where necessary and exist elsewhere in the encounter.   OBJECTIVE:    Physical Exam  Constitutional: He is oriented to person, place, and time. He appears well-developed.  Cardiovascular: Normal rate and regular rhythm.   Pulmonary/Chest: Breath sounds normal.  Musculoskeletal: Normal range of motion.  Neurological: He  is alert and oriented to person, place, and time.  Skin: He is not diaphoretic.  Vitals reviewed.   No results found for this or any previous visit (from the past 72 hour(s)).  No results found.  ASSESSMENT AND PLAN  Diagnoses and all orders for this visit:  Needs flu shot -     Flu Vaccine QUAD 36+ mos IM  Need for prophylactic vaccination against Streptococcus pneumoniae (pneumococcus) -     Pneumococcal polysaccharide vaccine 23-valent greater than or equal to 2yo subcutaneous/IM; Future -     Pneumococcal conjugate vaccine 13-valent IM    The patient is advised to call or return to clinic if he does not see an improvement in symptoms, or to seek the care of the closest emergency department if he worsens with the above plan.   Philis Fendt, MHS, PA-C [facility] 09/21/2016 6:37 PM

## 2016-10-09 DIAGNOSIS — H2513 Age-related nuclear cataract, bilateral: Secondary | ICD-10-CM | POA: Diagnosis not present

## 2017-06-22 DIAGNOSIS — N183 Chronic kidney disease, stage 3 (moderate): Secondary | ICD-10-CM | POA: Diagnosis not present

## 2017-07-05 DIAGNOSIS — N183 Chronic kidney disease, stage 3 (moderate): Secondary | ICD-10-CM | POA: Diagnosis not present

## 2017-07-05 DIAGNOSIS — Z125 Encounter for screening for malignant neoplasm of prostate: Secondary | ICD-10-CM | POA: Diagnosis not present

## 2017-07-05 DIAGNOSIS — E784 Other hyperlipidemia: Secondary | ICD-10-CM | POA: Diagnosis not present

## 2017-07-12 DIAGNOSIS — E784 Other hyperlipidemia: Secondary | ICD-10-CM | POA: Diagnosis not present

## 2017-07-12 DIAGNOSIS — G20 Parkinson's disease: Secondary | ICD-10-CM | POA: Diagnosis not present

## 2017-07-12 DIAGNOSIS — N183 Chronic kidney disease, stage 3 (moderate): Secondary | ICD-10-CM | POA: Diagnosis not present

## 2017-07-12 DIAGNOSIS — J310 Chronic rhinitis: Secondary | ICD-10-CM | POA: Diagnosis not present

## 2017-07-12 DIAGNOSIS — I10 Essential (primary) hypertension: Secondary | ICD-10-CM | POA: Diagnosis not present

## 2017-07-12 DIAGNOSIS — R972 Elevated prostate specific antigen [PSA]: Secondary | ICD-10-CM | POA: Diagnosis not present

## 2017-07-12 DIAGNOSIS — I872 Venous insufficiency (chronic) (peripheral): Secondary | ICD-10-CM | POA: Diagnosis not present

## 2017-07-12 DIAGNOSIS — Z0000 Encounter for general adult medical examination without abnormal findings: Secondary | ICD-10-CM | POA: Diagnosis not present

## 2017-07-12 DIAGNOSIS — Z1389 Encounter for screening for other disorder: Secondary | ICD-10-CM | POA: Diagnosis not present

## 2017-07-12 DIAGNOSIS — Z6829 Body mass index (BMI) 29.0-29.9, adult: Secondary | ICD-10-CM | POA: Diagnosis not present

## 2017-09-10 DIAGNOSIS — R350 Frequency of micturition: Secondary | ICD-10-CM | POA: Diagnosis not present

## 2017-09-10 DIAGNOSIS — R3912 Poor urinary stream: Secondary | ICD-10-CM | POA: Diagnosis not present

## 2017-09-10 DIAGNOSIS — R3911 Hesitancy of micturition: Secondary | ICD-10-CM | POA: Diagnosis not present

## 2017-09-10 DIAGNOSIS — R351 Nocturia: Secondary | ICD-10-CM | POA: Diagnosis not present

## 2017-09-16 DIAGNOSIS — I872 Venous insufficiency (chronic) (peripheral): Secondary | ICD-10-CM | POA: Diagnosis not present

## 2017-09-16 DIAGNOSIS — Z6829 Body mass index (BMI) 29.0-29.9, adult: Secondary | ICD-10-CM | POA: Diagnosis not present

## 2017-09-16 DIAGNOSIS — R600 Localized edema: Secondary | ICD-10-CM | POA: Diagnosis not present

## 2017-10-08 DIAGNOSIS — R2689 Other abnormalities of gait and mobility: Secondary | ICD-10-CM | POA: Diagnosis not present

## 2017-10-08 DIAGNOSIS — Z6828 Body mass index (BMI) 28.0-28.9, adult: Secondary | ICD-10-CM | POA: Diagnosis not present

## 2017-10-08 DIAGNOSIS — I872 Venous insufficiency (chronic) (peripheral): Secondary | ICD-10-CM | POA: Diagnosis not present

## 2017-10-08 DIAGNOSIS — G20 Parkinson's disease: Secondary | ICD-10-CM | POA: Diagnosis not present

## 2017-10-08 DIAGNOSIS — R296 Repeated falls: Secondary | ICD-10-CM | POA: Diagnosis not present

## 2017-10-11 DIAGNOSIS — Z87891 Personal history of nicotine dependence: Secondary | ICD-10-CM | POA: Diagnosis not present

## 2017-10-11 DIAGNOSIS — G20 Parkinson's disease: Secondary | ICD-10-CM | POA: Diagnosis not present

## 2017-10-11 DIAGNOSIS — M1990 Unspecified osteoarthritis, unspecified site: Secondary | ICD-10-CM | POA: Diagnosis not present

## 2017-10-11 DIAGNOSIS — I872 Venous insufficiency (chronic) (peripheral): Secondary | ICD-10-CM | POA: Diagnosis not present

## 2017-10-11 DIAGNOSIS — Z9181 History of falling: Secondary | ICD-10-CM | POA: Diagnosis not present

## 2017-10-11 DIAGNOSIS — N3281 Overactive bladder: Secondary | ICD-10-CM | POA: Diagnosis not present

## 2017-10-11 DIAGNOSIS — Z7722 Contact with and (suspected) exposure to environmental tobacco smoke (acute) (chronic): Secondary | ICD-10-CM | POA: Diagnosis not present

## 2017-10-11 DIAGNOSIS — N400 Enlarged prostate without lower urinary tract symptoms: Secondary | ICD-10-CM | POA: Diagnosis not present

## 2017-10-11 DIAGNOSIS — Z7982 Long term (current) use of aspirin: Secondary | ICD-10-CM | POA: Diagnosis not present

## 2017-10-11 DIAGNOSIS — I10 Essential (primary) hypertension: Secondary | ICD-10-CM | POA: Diagnosis not present

## 2017-10-13 DIAGNOSIS — M1990 Unspecified osteoarthritis, unspecified site: Secondary | ICD-10-CM | POA: Diagnosis not present

## 2017-10-13 DIAGNOSIS — N400 Enlarged prostate without lower urinary tract symptoms: Secondary | ICD-10-CM | POA: Diagnosis not present

## 2017-10-13 DIAGNOSIS — G20 Parkinson's disease: Secondary | ICD-10-CM | POA: Diagnosis not present

## 2017-10-13 DIAGNOSIS — I10 Essential (primary) hypertension: Secondary | ICD-10-CM | POA: Diagnosis not present

## 2017-10-13 DIAGNOSIS — I872 Venous insufficiency (chronic) (peripheral): Secondary | ICD-10-CM | POA: Diagnosis not present

## 2017-10-13 DIAGNOSIS — Z9181 History of falling: Secondary | ICD-10-CM | POA: Diagnosis not present

## 2017-10-20 DIAGNOSIS — I872 Venous insufficiency (chronic) (peripheral): Secondary | ICD-10-CM | POA: Diagnosis not present

## 2017-10-20 DIAGNOSIS — N400 Enlarged prostate without lower urinary tract symptoms: Secondary | ICD-10-CM | POA: Diagnosis not present

## 2017-10-20 DIAGNOSIS — M1990 Unspecified osteoarthritis, unspecified site: Secondary | ICD-10-CM | POA: Diagnosis not present

## 2017-10-20 DIAGNOSIS — G20 Parkinson's disease: Secondary | ICD-10-CM | POA: Diagnosis not present

## 2017-10-20 DIAGNOSIS — Z9181 History of falling: Secondary | ICD-10-CM | POA: Diagnosis not present

## 2017-10-20 DIAGNOSIS — I10 Essential (primary) hypertension: Secondary | ICD-10-CM | POA: Diagnosis not present

## 2017-10-22 DIAGNOSIS — I872 Venous insufficiency (chronic) (peripheral): Secondary | ICD-10-CM | POA: Diagnosis not present

## 2017-10-22 DIAGNOSIS — N400 Enlarged prostate without lower urinary tract symptoms: Secondary | ICD-10-CM | POA: Diagnosis not present

## 2017-10-22 DIAGNOSIS — G20 Parkinson's disease: Secondary | ICD-10-CM | POA: Diagnosis not present

## 2017-10-22 DIAGNOSIS — M1990 Unspecified osteoarthritis, unspecified site: Secondary | ICD-10-CM | POA: Diagnosis not present

## 2017-10-22 DIAGNOSIS — Z9181 History of falling: Secondary | ICD-10-CM | POA: Diagnosis not present

## 2017-10-22 DIAGNOSIS — I10 Essential (primary) hypertension: Secondary | ICD-10-CM | POA: Diagnosis not present

## 2017-10-25 DIAGNOSIS — Z9181 History of falling: Secondary | ICD-10-CM | POA: Diagnosis not present

## 2017-10-25 DIAGNOSIS — I10 Essential (primary) hypertension: Secondary | ICD-10-CM | POA: Diagnosis not present

## 2017-10-25 DIAGNOSIS — I872 Venous insufficiency (chronic) (peripheral): Secondary | ICD-10-CM | POA: Diagnosis not present

## 2017-10-25 DIAGNOSIS — N400 Enlarged prostate without lower urinary tract symptoms: Secondary | ICD-10-CM | POA: Diagnosis not present

## 2017-10-25 DIAGNOSIS — M1990 Unspecified osteoarthritis, unspecified site: Secondary | ICD-10-CM | POA: Diagnosis not present

## 2017-10-25 DIAGNOSIS — G20 Parkinson's disease: Secondary | ICD-10-CM | POA: Diagnosis not present

## 2017-10-26 ENCOUNTER — Encounter (HOSPITAL_COMMUNITY): Payer: Self-pay

## 2017-10-26 ENCOUNTER — Emergency Department (HOSPITAL_COMMUNITY): Payer: Medicare Other

## 2017-10-26 ENCOUNTER — Inpatient Hospital Stay (HOSPITAL_COMMUNITY)
Admission: EM | Admit: 2017-10-26 | Discharge: 2017-10-28 | DRG: 069 | Disposition: A | Discharge status: Home Health | Payer: Medicare Other | Attending: Physician Assistant | Admitting: Physician Assistant

## 2017-10-26 DIAGNOSIS — E875 Hyperkalemia: Secondary | ICD-10-CM | POA: Diagnosis present

## 2017-10-26 DIAGNOSIS — Z85810 Personal history of malignant neoplasm of tongue: Secondary | ICD-10-CM | POA: Diagnosis not present

## 2017-10-26 DIAGNOSIS — R4020 Unspecified coma: Secondary | ICD-10-CM | POA: Diagnosis not present

## 2017-10-26 DIAGNOSIS — Z87891 Personal history of nicotine dependence: Secondary | ICD-10-CM

## 2017-10-26 DIAGNOSIS — R2681 Unsteadiness on feet: Secondary | ICD-10-CM | POA: Diagnosis present

## 2017-10-26 DIAGNOSIS — G20 Parkinson's disease: Secondary | ICD-10-CM | POA: Diagnosis present

## 2017-10-26 DIAGNOSIS — E785 Hyperlipidemia, unspecified: Secondary | ICD-10-CM | POA: Diagnosis present

## 2017-10-26 DIAGNOSIS — G3183 Dementia with Lewy bodies: Secondary | ICD-10-CM | POA: Diagnosis present

## 2017-10-26 DIAGNOSIS — I6789 Other cerebrovascular disease: Secondary | ICD-10-CM | POA: Diagnosis not present

## 2017-10-26 DIAGNOSIS — F0390 Unspecified dementia without behavioral disturbance: Secondary | ICD-10-CM | POA: Diagnosis present

## 2017-10-26 DIAGNOSIS — Z79899 Other long term (current) drug therapy: Secondary | ICD-10-CM

## 2017-10-26 DIAGNOSIS — I129 Hypertensive chronic kidney disease with stage 1 through stage 4 chronic kidney disease, or unspecified chronic kidney disease: Secondary | ICD-10-CM | POA: Diagnosis present

## 2017-10-26 DIAGNOSIS — R4701 Aphasia: Secondary | ICD-10-CM | POA: Diagnosis present

## 2017-10-26 DIAGNOSIS — R2990 Unspecified symptoms and signs involving the nervous system: Secondary | ICD-10-CM

## 2017-10-26 DIAGNOSIS — R29818 Other symptoms and signs involving the nervous system: Secondary | ICD-10-CM | POA: Diagnosis not present

## 2017-10-26 DIAGNOSIS — R296 Repeated falls: Secondary | ICD-10-CM | POA: Diagnosis present

## 2017-10-26 DIAGNOSIS — N183 Chronic kidney disease, stage 3 (moderate): Secondary | ICD-10-CM | POA: Diagnosis present

## 2017-10-26 DIAGNOSIS — F0281 Dementia in other diseases classified elsewhere with behavioral disturbance: Secondary | ICD-10-CM | POA: Diagnosis not present

## 2017-10-26 DIAGNOSIS — F0280 Dementia in other diseases classified elsewhere without behavioral disturbance: Secondary | ICD-10-CM | POA: Diagnosis present

## 2017-10-26 DIAGNOSIS — R4182 Altered mental status, unspecified: Secondary | ICD-10-CM | POA: Diagnosis present

## 2017-10-26 DIAGNOSIS — N400 Enlarged prostate without lower urinary tract symptoms: Secondary | ICD-10-CM | POA: Diagnosis present

## 2017-10-26 DIAGNOSIS — Z7982 Long term (current) use of aspirin: Secondary | ICD-10-CM | POA: Diagnosis not present

## 2017-10-26 DIAGNOSIS — Z96641 Presence of right artificial hip joint: Secondary | ICD-10-CM | POA: Diagnosis present

## 2017-10-26 DIAGNOSIS — I4891 Unspecified atrial fibrillation: Secondary | ICD-10-CM | POA: Diagnosis not present

## 2017-10-26 DIAGNOSIS — I340 Nonrheumatic mitral (valve) insufficiency: Secondary | ICD-10-CM | POA: Diagnosis not present

## 2017-10-26 DIAGNOSIS — R29810 Facial weakness: Secondary | ICD-10-CM | POA: Diagnosis present

## 2017-10-26 DIAGNOSIS — G459 Transient cerebral ischemic attack, unspecified: Secondary | ICD-10-CM | POA: Diagnosis not present

## 2017-10-26 DIAGNOSIS — R531 Weakness: Secondary | ICD-10-CM | POA: Diagnosis not present

## 2017-10-26 DIAGNOSIS — R4781 Slurred speech: Secondary | ICD-10-CM | POA: Diagnosis not present

## 2017-10-26 LAB — PROTIME-INR
INR: 1.1
PROTHROMBIN TIME: 14.1 s (ref 11.4–15.2)

## 2017-10-26 LAB — COMPREHENSIVE METABOLIC PANEL
ALT: 12 U/L — AB (ref 17–63)
AST: 35 U/L (ref 15–41)
Albumin: 3.3 g/dL — ABNORMAL LOW (ref 3.5–5.0)
Alkaline Phosphatase: 73 U/L (ref 38–126)
Anion gap: 7 (ref 5–15)
BILIRUBIN TOTAL: 1.6 mg/dL — AB (ref 0.3–1.2)
BUN: 23 mg/dL — AB (ref 6–20)
CO2: 25 mmol/L (ref 22–32)
CREATININE: 1.25 mg/dL — AB (ref 0.61–1.24)
Calcium: 8.8 mg/dL — ABNORMAL LOW (ref 8.9–10.3)
Chloride: 103 mmol/L (ref 101–111)
GFR calc Af Amer: 54 mL/min — ABNORMAL LOW (ref >60)
GFR, EST NON AFRICAN AMERICAN: 46 mL/min — AB (ref >60)
Glucose, Bld: 109 mg/dL — ABNORMAL HIGH (ref 65–99)
POTASSIUM: 5.5 mmol/L — AB (ref 3.5–5.1)
Sodium: 135 mmol/L (ref 135–145)
TOTAL PROTEIN: 6.1 g/dL — AB (ref 6.5–8.1)

## 2017-10-26 LAB — I-STAT CHEM 8, ED
BUN: 36 mg/dL — AB (ref 6–20)
CHLORIDE: 103 mmol/L (ref 101–111)
CREATININE: 1.3 mg/dL — AB (ref 0.61–1.24)
Calcium, Ion: 1.19 mmol/L (ref 1.15–1.40)
GLUCOSE: 96 mg/dL (ref 65–99)
HCT: 42 % (ref 39.0–52.0)
Hemoglobin: 14.3 g/dL (ref 13.0–17.0)
POTASSIUM: 5.8 mmol/L — AB (ref 3.5–5.1)
Sodium: 138 mmol/L (ref 135–145)
TCO2: 29 mmol/L (ref 22–32)

## 2017-10-26 LAB — APTT: aPTT: 30 seconds (ref 24–36)

## 2017-10-26 LAB — RAPID URINE DRUG SCREEN, HOSP PERFORMED
AMPHETAMINES: NOT DETECTED
BENZODIAZEPINES: NOT DETECTED
Barbiturates: NOT DETECTED
Cocaine: NOT DETECTED
OPIATES: NOT DETECTED
TETRAHYDROCANNABINOL: NOT DETECTED

## 2017-10-26 LAB — DIFFERENTIAL
Basophils Absolute: 0 10*3/uL (ref 0.0–0.1)
Basophils Relative: 1 %
EOS ABS: 0.3 10*3/uL (ref 0.0–0.7)
EOS PCT: 6 %
LYMPHS ABS: 1.1 10*3/uL (ref 0.7–4.0)
LYMPHS PCT: 24 %
MONOS PCT: 9 %
Monocytes Absolute: 0.4 10*3/uL (ref 0.1–1.0)
Neutro Abs: 2.7 10*3/uL (ref 1.7–7.7)
Neutrophils Relative %: 60 %

## 2017-10-26 LAB — CBC
HEMATOCRIT: 41.9 % (ref 39.0–52.0)
HEMOGLOBIN: 14.5 g/dL (ref 13.0–17.0)
MCH: 34.1 pg — AB (ref 26.0–34.0)
MCHC: 34.6 g/dL (ref 30.0–36.0)
MCV: 98.6 fL (ref 78.0–100.0)
Platelets: 156 10*3/uL (ref 150–400)
RBC: 4.25 MIL/uL (ref 4.22–5.81)
RDW: 13.5 % (ref 11.5–15.5)
WBC: 4.4 10*3/uL (ref 4.0–10.5)

## 2017-10-26 LAB — ETHANOL: Alcohol, Ethyl (B): <10 mg/dL (ref <10)

## 2017-10-26 LAB — URINALYSIS, ROUTINE W REFLEX MICROSCOPIC
BILIRUBIN URINE: NEGATIVE
GLUCOSE, UA: NEGATIVE mg/dL
HGB URINE DIPSTICK: NEGATIVE
Ketones, ur: NEGATIVE mg/dL
Leukocytes, UA: NEGATIVE
Nitrite: NEGATIVE
PROTEIN: NEGATIVE mg/dL
Specific Gravity, Urine: 1.017 (ref 1.005–1.030)
pH: 6 (ref 5.0–8.0)

## 2017-10-26 LAB — BRAIN NATRIURETIC PEPTIDE: B Natriuretic Peptide: 229.5 pg/mL — ABNORMAL HIGH (ref 0.0–100.0)

## 2017-10-26 LAB — I-STAT TROPONIN, ED: Troponin i, poc: 0 ng/mL (ref 0.00–0.08)

## 2017-10-26 MED ORDER — HEPARIN SODIUM (PORCINE) 5000 UNIT/ML IJ SOLN
5000.0000 [IU] | Freq: Three times a day (TID) | INTRAMUSCULAR | Status: DC
  Administered 2017-10-27 – 2017-10-28 (×4): 5000 [IU] via SUBCUTANEOUS
  Filled 2017-10-26 (×3): qty 1

## 2017-10-26 MED ORDER — FINASTERIDE 5 MG PO TABS
5.0000 mg | ORAL_TABLET | Freq: Every day | ORAL | Status: DC
  Administered 2017-10-28: 5 mg via ORAL
  Filled 2017-10-26 (×2): qty 1

## 2017-10-26 MED ORDER — ACETAMINOPHEN 650 MG RE SUPP: 650.0000 mg | RECTAL | Status: DC | PRN

## 2017-10-26 MED ORDER — SODIUM CHLORIDE 0.9 % IV SOLN
INTRAVENOUS | Status: DC
  Administered 2017-10-27: 01:00:00 via INTRAVENOUS

## 2017-10-26 MED ORDER — SENNOSIDES-DOCUSATE SODIUM 8.6-50 MG PO TABS: 1.0000 | ORAL_TABLET | Freq: Every evening | ORAL | Status: DC | PRN

## 2017-10-26 MED ORDER — TAMSULOSIN HCL 0.4 MG PO CAPS
0.4000 mg | ORAL_CAPSULE | Freq: Every day | ORAL | Status: DC
  Administered 2017-10-27: 0.4 mg via ORAL
  Filled 2017-10-26: qty 1

## 2017-10-26 MED ORDER — ASPIRIN EC 325 MG PO TBEC
325.0000 mg | DELAYED_RELEASE_TABLET | Freq: Every day | ORAL | Status: DC
  Administered 2017-10-28: 325 mg via ORAL
  Filled 2017-10-26 (×2): qty 1

## 2017-10-26 MED ORDER — ACETAMINOPHEN 160 MG/5ML PO SOLN: 650.0000 mg | ORAL | Status: DC | PRN

## 2017-10-26 MED ORDER — SODIUM POLYSTYRENE SULFONATE 15 GM/60ML PO SUSP
15.0000 g | Freq: Once | ORAL | Status: AC
  Administered 2017-10-26: 15 g via ORAL
  Filled 2017-10-26: qty 60

## 2017-10-26 MED ORDER — ACETAMINOPHEN 325 MG PO TABS: 650.0000 mg | ORAL_TABLET | ORAL | Status: DC | PRN

## 2017-10-26 MED ORDER — ROPINIROLE HCL 1 MG PO TABS
1.0000 mg | ORAL_TABLET | Freq: Every day | ORAL | Status: DC
  Administered 2017-10-27: 1 mg via ORAL
  Filled 2017-10-26: qty 1

## 2017-10-26 MED ORDER — ADULT MULTIVITAMIN W/MINERALS CH
1.0000 | ORAL_TABLET | Freq: Every day | ORAL | Status: DC
  Administered 2017-10-28: 1 via ORAL
  Filled 2017-10-26 (×2): qty 1

## 2017-10-26 MED ORDER — STROKE: EARLY STAGES OF RECOVERY BOOK
Freq: Once | Status: AC
  Administered 2017-10-27: 01:00:00

## 2017-10-26 NOTE — ED Triage Notes (Signed)
Pt was at home with CNA caregiver when pt woke up from nap and observed to have unintelligible speech, left sided facial droop and weakness in left arm and leg. CNA walked pt to bathroom 5 minutes after that and pt used bathroom. EMS arrived pt was A"coming out of it" and upon arrival pt had no noticeable defects.

## 2017-10-26 NOTE — ED Notes (Signed)
Transported to CT 

## 2017-10-26 NOTE — H&P (Signed)
History and Physical    Nicholas Andrews [id number] DOB: Apr 27, 1918 DOA: 10/26/2017  PCP: Leanna Battles, MD  Patient coming from: home   Chief Complaint: Was noted by CNA caregiver when pt woke up from nap and observed to have unintelligible speech, left sided facial droop and weakness in left arm and leg.  HPI: Nicholas Andrews is a 83 y.o. male with medical history significant of BPH, Louie body dementia, Parkinson's who is presenting with above complaints which lasted for less than 2 hours reportedly. Patient is symptom-free per daughter at bedside. Patient has no new particular complaints and is poor historian as he does not remember what occurred.  ED Course:  Patient had CT scan which was negative for acute abnormalities we were consulted for further evaluation recommendations given above complaints. Will admit for TIA workup  Review of Systems: Unable to assess secondary to dementia Past Medical History:  Diagnosis Date  . Acute blood loss anemia   . BPH (benign prostatic hyperplasia)   . Cataract   . CKD (chronic kidney disease), stage III ([place])   . Closed displaced fracture of right femoral neck with routine healing   . H/O tongue cancer   . Lewy body dementia   . Parkinson's syndrome ([place])   . Protein calorie malnutrition ([place])   . Unsteady gait     Past Surgical History:  Procedure Laterality Date  . APPENDECTOMY    . EYE SURGERY    . HIP ARTHROPLASTY Right 01/08/2016   Procedure: Right Hip Hemiarthroplasty;  Surgeon: Marybelle Killings, MD;  Location: [facility]S;  Service: Orthopedics;  Laterality: Right;  . TONSILLECTOMY AND ADENOIDECTOMY    . TRANSURETHRAL RESECTION OF PROSTATE       reports that he has quit smoking. He does not have any smokeless tobacco history on file. He reports that he does not drink alcohol or use drugs.  No Known Allergies  Family History  Problem Relation Age of Onset  . Dementia Mother     Prior to Admission medications   Medication Sig  Start Date End Date Taking? Authorizing Provider  aspirin EC 81 MG tablet Take 81 mg by mouth daily.   Yes [provider]  finasteride (PROSCAR) 5 MG tablet Take 5 mg by mouth daily. 09/20/17  Yes [provider]  Multiple Vitamin (MULTIVITAMIN WITH MINERALS) TABS tablet Take 1 tablet by mouth daily.   Yes [provider]  rOPINIRole (REQUIP) 1 MG tablet Take 1 mg by mouth at bedtime. 12/05/15  Yes [provider]  tamsulosin (FLOMAX) 0.4 MG CAPS capsule Take 0.4 mg by mouth at bedtime.   Yes [provider]  triamcinolone cream (KENALOG) 0.1 % Apply 1 application topically daily as needed. 10/08/17  Yes [provider]  aspirin EC 325 MG tablet Take 1 tablet (325 mg total) by mouth daily. Patient not taking: Reported on 10/26/2017 01/09/16   Lanae Crumbly, PA-C    Physical Exam: Vitals:   10/26/17 1645 10/26/17 1654 10/26/17 1700 10/26/17 1750  BP: 134/74  (!) 140/57 (!) 153/65  Pulse: (!) 42  61 68  Resp: 18  (!) 21 14  Temp:  97.8 F (36.6 C)    TempSrc:      SpO2: 96%  95% 100%    Constitutional: NAD, calm, comfortable Vitals:   10/26/17 1645 10/26/17 1654 10/26/17 1700 10/26/17 1750  BP: 134/74  (!) 140/57 (!) 153/65  Pulse: (!) 42  61 68  Resp: 18  (!)  21 14  Temp:  97.8 F (36.6 C)    TempSrc:      SpO2: 96%  95% 100%   Eyes: PERRL, lids and conjunctivae normal ENMT: Mucous membranes are moist. Posterior pharynx clear of any exudate or lesions. Neck: normal, supple, no masses, no thyromegaly Respiratory: clear to auscultation bilaterally, no wheezing, no crackles. Normal respiratory effort. No accessory muscle use.  Cardiovascular: s1 and s2 wnl, no rubs, no gallops Abdomen: no tenderness, no masses palpated. No hepatosplenomegaly. Bowel sounds positive.  Musculoskeletal: no clubbing / cyanosis.  Skin: no rashes, lesions, ulcers. No induration Neurologic: Hard of hearing, facial asymmetry, sensation to light touch  normal Psychiatric: mood and affect appropriate    Labs on Admission: I have personally reviewed following labs and imaging studies  CBC:  Recent Labs Lab 10/26/17 1455 10/26/17 1509  WBC 4.4  --   NEUTROABS 2.7  --   HGB 14.5 14.3  HCT 41.9 42.0  MCV 98.6  --   PLT 156  --    Basic Metabolic Panel:  Recent Labs Lab 10/26/17 1509 10/26/17 1648  NA 138 135  K 5.8* 5.5*  CL 103 103  CO2  --  25  GLUCOSE 96 109*  BUN 36* 23*  CREATININE 1.30* 1.25*  CALCIUM  --  8.8*   GFR: CrCl cannot be calculated (Unknown ideal weight.). Liver Function Tests:  Recent Labs Lab 10/26/17 1648  AST 35  ALT 12*  ALKPHOS 73  BILITOT 1.6*  PROT 6.1*  ALBUMIN 3.3*   No results for input(s): LIPASE, AMYLASE in the last 168 hours. No results for input(s): AMMONIA in the last 168 hours. Coagulation Profile:  Recent Labs Lab 10/26/17 1648  INR 1.10   Cardiac Enzymes: No results for input(s): CKTOTAL, CKMB, CKMBINDEX, TROPONINI in the last 168 hours. BNP (last 3 results) No results for input(s): PROBNP in the last 8760 hours. HbA1C: No results for input(s): HGBA1C in the last 72 hours. CBG: No results for input(s): GLUCAP in the last 168 hours. Lipid Profile: No results for input(s): CHOL, HDL, LDLCALC, TRIG, CHOLHDL, LDLDIRECT in the last 72 hours. Thyroid Function Tests: No results for input(s): TSH, T4TOTAL, FREET4, T3FREE, THYROIDAB in the last 72 hours. Anemia Panel: No results for input(s): VITAMINB12, FOLATE, FERRITIN, TIBC, IRON, RETICCTPCT in the last 72 hours. Urine analysis:    Component Value Date/Time   COLORURINE YELLOW 10/26/2017 1626   APPEARANCEUR CLEAR 10/26/2017 1626   LABSPEC 1.017 10/26/2017 1626   PHURINE 6.0 10/26/2017 1626   GLUCOSEU NEGATIVE 10/26/2017 1626   HGBUR NEGATIVE 10/26/2017 1626   BILIRUBINUR NEGATIVE 10/26/2017 1626   BILIRUBINUR neg 03/05/2015 1646   KETONESUR NEGATIVE 10/26/2017 1626   PROTEINUR NEGATIVE 10/26/2017 1626    UROBILINOGEN 0.2 03/05/2015 1646   NITRITE NEGATIVE 10/26/2017 1626   LEUKOCYTESUR NEGATIVE 10/26/2017 1626    Radiological Exams on Admission: Ct Head Wo Contrast  Result Date: 10/26/2017 CLINICAL DATA:  Altered level of consciousness with unintelligible speech. EXAM: CT HEAD WITHOUT CONTRAST TECHNIQUE: Contiguous axial images were obtained from the base of the skull through the vertex without intravenous contrast. COMPARISON:  None. FINDINGS: BRAIN: There is sulcal and ventricular prominence consistent with superficial and central atrophy. No intraparenchymal hemorrhage, mass effect nor midline shift. Periventricular and subcortical white matter hypodensities consistent with chronic small vessel ischemic disease are identified. Remote left posterior parietal cortical and subcortical infarct with encephalomalacia. No acute large vascular territory infarcts. No abnormal extra-axial fluid collections. Basal cisterns are not  effaced and midline. VASCULAR: Mild to moderate calcific atherosclerosis of the carotid siphons. No hyperdense vessels. SKULL: No skull fracture. No significant scalp soft tissue swelling. SINUSES/ORBITS: The mastoid air-cells are clear. Mild mucosal thickening of the right maxillary sinus.The included ocular globes and orbital contents are non-suspicious. OTHER: Scattered intravascular dots of air likely iatrogenic within veins of the right cheek and venous plexus of the pituitary fossa. IMPRESSION: 1. No acute intracranial abnormality. Remote left posterior parietal cortical and subcortical infarct. 2. Atrophy with chronic small vessel ischemia. Electronically Signed   By: Ashley Royalty M.D.   On: 10/26/2017 15:34    EKG: Independently reviewed. Difficult to delineate p waves 60 BPM no ST elevations or depressions  Assessment/Plan Active Problems:   Stroke-like symptom - Will place stroke work up order set - Neurology not consulted. If MRI positive for stroke please call - for  now will complete work up. - was on aspirin 81 mg at home will place on aspirin 325 mg - telemetry    BPH (benign prostatic hyperplasia) - continue home medication regimen.    Hyperkalemia - telemetry - kayexalate - BMP for tomorrow.  DVT prophylaxis: heparin Code Status: full Family Communication: daughter at bedside. Disposition Plan: Telemetry Consults called: None Admission status: observation   Velvet Bathe MD [facility] Pager [phone]  If 7PM-7AM, please contact night-coverage [email or web address] Password [email or web address]  10/26/2017, 6:08 PM

## 2017-10-26 NOTE — ED Notes (Signed)
Admitting provider at bedside.

## 2017-10-26 NOTE — ED Notes (Signed)
Attempted to call report x 1  

## 2017-10-26 NOTE — ED Provider Notes (Signed)
[facility] EMERGENCY DEPARTMENT Provider Note   CSN: [id number] Arrival date & time: 10/26/17  1409     History   Chief Complaint Chief Complaint  Patient presents with  . Extremity Weakness    HPI Nicholas Andrews is a 83 y.o. male.  HPI   Patient is a 83 year old male presenting with strokelike symptoms have now resolved.  Patient has history of tongue cancer, Lewy body dementia, Parkinson's.  Patient was at home with the CRNA.  He developed left arm weakness and left facial droop.  Resolved on arrival to the ED.  Patient's daughter has noticed more swelling in his legs.  And patient recently got over urinary tract infection.  Past Medical History:  Diagnosis Date  . Acute blood loss anemia   . BPH (benign prostatic hyperplasia)   . Cataract   . CKD (chronic kidney disease), stage III ([place])   . Closed displaced fracture of right femoral neck with routine healing   . H/O tongue cancer   . Lewy body dementia   . Parkinson's syndrome ([place])   . Protein calorie malnutrition ([place])   . Unsteady gait     Patient Active Problem List   Diagnosis Date Noted  . Fracture of femoral neck, right, closed ([place]) 01/07/2016  . Hyperkalemia 01/07/2016  . Acute renal failure superimposed on stage 3 chronic kidney disease ([place]) 01/07/2016  . BPH (benign prostatic hyperplasia)   . Parkinson's syndrome ([place])   . Closed fracture of neck of right femur ([place])   . Hard of hearing 02/18/2012  . TUBULOVILLOUS ADENOMA, COLON 01/02/2010  . FECAL OCCULT BLOOD 10/25/2008  . PERSONAL HX COLONIC POLYPS 10/25/2008    Past Surgical History:  Procedure Laterality Date  . APPENDECTOMY    . EYE SURGERY    . HIP ARTHROPLASTY Right 01/08/2016   Procedure: Right Hip Hemiarthroplasty;  Surgeon: Marybelle Killings, MD;  Location: [facility] ORS;  Service: Orthopedics;  Laterality: Right;  . TONSILLECTOMY AND ADENOIDECTOMY    . TRANSURETHRAL RESECTION OF PROSTATE         Home Medications     Prior to Admission medications   Medication Sig Start Date End Date Taking? Authorizing Provider  aspirin EC 325 MG tablet Take 1 tablet (325 mg total) by mouth daily. 01/09/16   Lanae Crumbly, PA-C  calcium carbonate (TUMS - DOSED IN MG ELEMENTAL CALCIUM) 500 MG chewable tablet Chew 1 tablet by mouth daily as needed for indigestion or heartburn.    [provider]  Multiple Vitamin (MULTIVITAMIN WITH MINERALS) TABS tablet Take 1 tablet by mouth daily.    [provider]  Protein (PROCEL) POWD Take 2 scoop by mouth 2 (two) times daily.    [provider]  rOPINIRole (REQUIP) 1 MG tablet Take 1 mg by mouth at bedtime. 12/05/15   [provider]    Family History Family History  Problem Relation Age of Onset  . Dementia Mother     Social History Social History  Substance Use Topics  . Smoking status: Former Research scientist (life sciences)  . Smokeless tobacco: Not on file  . Alcohol use No     Allergies   Patient has no known allergies.   Review of Systems Review of Systems  Unable to perform ROS: Dementia  Constitutional: Negative for activity change.  Respiratory: Negative for shortness of breath.   Cardiovascular: Negative for chest pain.  Gastrointestinal: Negative for abdominal pain.  Psychiatric/Behavioral: Positive for confusion.     Physical  Exam Updated Vital Signs BP 138/71   Pulse (!) 50   Temp 97.6 F (36.4 C) (Oral)   Resp 18   SpO2 100%   Physical Exam  Constitutional: He is oriented to person, place, and time. He appears well-nourished.  HENT:  Head: Normocephalic.  Eyes: Conjunctivae are normal. Right eye exhibits no discharge. Left eye exhibits no discharge.  Cardiovascular:  Slow.  Pulmonary/Chest: Effort normal and breath sounds normal. No respiratory distress. He has no wheezes.  Abdominal: Soft. Bowel sounds are normal. He exhibits no distension. There is no tenderness.  Neurological: He is oriented to person, place, and time.   Equal strength bilaterally upper and lower extremities negative pronator drift. Normal sensation bilaterally. Speech comprehensible, no slurring. Facial nerve tested and appears grossly normal. Alert and oriented 3.   Skin: Skin is warm and dry. He is not diaphoretic.  Psychiatric: He has a normal mood and affect. His behavior is normal.     ED Treatments / Results  Labs (all labs ordered are listed, but only abnormal results are displayed) Labs Reviewed  CBC - Abnormal; Notable for the following:       Result Value   MCH 34.1 (*)    All other components within normal limits  I-STAT CHEM 8, ED - Abnormal; Notable for the following:    Potassium 5.8 (*)    BUN 36 (*)    Creatinine, Ser 1.30 (*)    All other components within normal limits  DIFFERENTIAL  ETHANOL  COMPREHENSIVE METABOLIC PANEL  RAPID URINE DRUG SCREEN, HOSP PERFORMED  URINALYSIS, ROUTINE W REFLEX MICROSCOPIC  I-STAT TROPONIN, ED    EKG  EKG Interpretation None       Radiology No results found.  Procedures Procedures (including critical care time)  Medications Ordered in ED Medications - No data to display   Initial Impression / Assessment and Plan / ED Course  I have reviewed the triage vital signs and the nursing notes.  Pertinent labs & imaging results that were available during my care of the patient were reviewed by me and considered in my medical decision making (see chart for details).     Patient is a 84 year old male presenting with strokelike symptoms have now resolved.  Patient has history of tongue cancer, Lewy body dementia, Parkinson's.  Patient was at home with the CRNA.  He developed left arm weakness and left facial droop.  Resolved on arrival to the ED.  Patient's daughter has noticed more swelling in his legs.  And patient recently got over urinary tract infection.  BNP and UA pending.  3:41 PM Will admit patient for TIA/stroke workup.  Patient also appears to have new  bradycardia/A. Fib.  Discussed with medicine.  Will admit.   Final Clinical Impressions(s) / ED Diagnoses   Final diagnoses:  None    New Prescriptions New Prescriptions   No medications on file     Macarthur Critchley, MD 10/26/17 2346

## 2017-10-27 ENCOUNTER — Observation Stay (HOSPITAL_COMMUNITY): Payer: Medicare Other

## 2017-10-27 ENCOUNTER — Observation Stay (HOSPITAL_BASED_OUTPATIENT_CLINIC_OR_DEPARTMENT_OTHER): Payer: Medicare Other

## 2017-10-27 DIAGNOSIS — I340 Nonrheumatic mitral (valve) insufficiency: Secondary | ICD-10-CM | POA: Diagnosis not present

## 2017-10-27 DIAGNOSIS — R2990 Unspecified symptoms and signs involving the nervous system: Secondary | ICD-10-CM

## 2017-10-27 DIAGNOSIS — G20 Parkinson's disease: Secondary | ICD-10-CM

## 2017-10-27 DIAGNOSIS — I4891 Unspecified atrial fibrillation: Secondary | ICD-10-CM

## 2017-10-27 DIAGNOSIS — F0281 Dementia in other diseases classified elsewhere with behavioral disturbance: Secondary | ICD-10-CM

## 2017-10-27 DIAGNOSIS — R29818 Other symptoms and signs involving the nervous system: Secondary | ICD-10-CM | POA: Diagnosis not present

## 2017-10-27 DIAGNOSIS — E875 Hyperkalemia: Secondary | ICD-10-CM

## 2017-10-27 LAB — LIPID PANEL
CHOL/HDL RATIO: 3.3 ratio
Cholesterol: 169 mg/dL (ref 0–200)
HDL: 51 mg/dL (ref >40)
LDL Cholesterol: 107 mg/dL — ABNORMAL HIGH (ref 0–99)
Triglycerides: 54 mg/dL (ref <150)
VLDL: 11 mg/dL (ref 0–40)

## 2017-10-27 LAB — ECHOCARDIOGRAM COMPLETE
CHL CUP TV REG PEAK VELOCITY: 286 cm/s
EWDT: 208 ms
FS: 21 % — AB (ref 28–44)
IV/PV OW: 0.99
LA diam end sys: 53 mm
LA diam index: 2.34 cm/m2
LA vol: 87.2 mL
LASIZE: 53 mm
LAVOLA4C: 86 mL
LAVOLIN: 38.6 mL/m2
LV PW d: 8.79 mm — AB (ref 0.6–1.1)
LVOT area: 4.15 cm2
LVOT diameter: 23 mm
MV Dec: 208
MVPG: 5 mmHg
MVPKAVEL: 30.1 m/s
MVPKEVEL: 109 m/s
P 1/2 time: 610 ms
RV TAPSE: 18.6 mm
TRMAXVEL: 286 cm/s

## 2017-10-27 LAB — BASIC METABOLIC PANEL
ANION GAP: 8 (ref 5–15)
BUN: 17 mg/dL (ref 6–20)
CALCIUM: 9.1 mg/dL (ref 8.9–10.3)
CO2: 25 mmol/L (ref 22–32)
Chloride: 106 mmol/L (ref 101–111)
Creatinine, Ser: 1.26 mg/dL — ABNORMAL HIGH (ref 0.61–1.24)
GFR calc Af Amer: 53 mL/min — ABNORMAL LOW (ref >60)
GFR, EST NON AFRICAN AMERICAN: 46 mL/min — AB (ref >60)
GLUCOSE: 94 mg/dL (ref 65–99)
Potassium: 3.9 mmol/L (ref 3.5–5.1)
SODIUM: 139 mmol/L (ref 135–145)

## 2017-10-27 LAB — HEMOGLOBIN A1C
HEMOGLOBIN A1C: 5.4 % (ref 4.8–5.6)
MEAN PLASMA GLUCOSE: 108.28 mg/dL

## 2017-10-27 MED ORDER — LORAZEPAM 2 MG/ML IJ SOLN
0.5000 mg | Freq: Once | INTRAMUSCULAR | Status: AC
  Administered 2017-10-27: 0.5 mg via INTRAVENOUS
  Filled 2017-10-27: qty 1

## 2017-10-27 NOTE — Progress Notes (Signed)
SLP Cancellation Note  Patient Details Name: Nicholas Andrews MRN: [id number] DOB: 1918-02-15   Cancelled treatment:       Reason Eval/Treat Not Completed: SLP screened, no needs identified, will sign off. Pt sleeping this am, caregiver reports he is often confused after waking up and does not participate well. She also reports she sees his speech and behavior back to baseline. Will defer cognitive linguistic eval given baseline dementia.   Darryll Raju, Katherene Ponto 10/27/2017, 9:40 AM

## 2017-10-27 NOTE — Evaluation (Signed)
Occupational Therapy Evaluation Patient Details Name: Nicholas Andrews MRN: [id number] DOB: [birth year] Today's Date: 10/27/2017    History of Present Illness 83 y.o.malewith medical history significant of BPH, Louie body dementia, Parkinson's who is presenting with unintelligible speech, left sided facial droop and weakness in left arm and leg. CT on 10/30 positive for Remote left posterior parietal   Clinical Impression   Per caregiver, pt required assist with ADL PTA, was able to self feed and perform functional mobility with use of RW and close min guard for safety. Currently pt requires max assist +2 for ADL and sit to stand at EOB. Pt becoming agitated and combative during session; noted to have increased confusion and generalized weakness greater than baseline. Recommending SNF for follow up to maximize independence and safety with ADL and functional mobility prior to return home. Pt would benefit from continued skilled OT to address established goals.    Follow Up Recommendations  SNF;Supervision/Assistance - 24 hour    Equipment Recommendations  None recommended by OT    Recommendations for Other Services       Precautions / Restrictions Precautions Precautions: Fall Restrictions Weight Bearing Restrictions: No      Mobility Bed Mobility Overal bed mobility: Needs Assistance Bed Mobility: Supine to Sit;Sit to Supine     Supine to sit: Max assist;+2 for physical assistance;+2 for safety/equipment Sit to supine: Max assist;+2 for physical assistance;+2 for safety/equipment   General bed mobility comments: Max assist to power trunk up to upright position and rotate hips to EOB, assist to initiate upright. Max multi modal cues required to assist patient due to agitation and restlessness  Transfers Overall transfer level: Needs assistance Equipment used:  (2 person face to face) Transfers: Sit to/from Stand Sit to Stand: Max assist;+2 physical assistance          General transfer comment: +2 max assist to power up, moderate assist to maintain standing.    Balance Overall balance assessment: Needs assistance Sitting-balance support: Feet supported Sitting balance-Leahy Scale: Poor Sitting balance - Comments: min guard to min assist for EOB   Standing balance support: During functional activity Standing balance-Leahy Scale: Poor Standing balance comment: reliant on bilateral UE support, limited ability to maintain upright, required hands on physical assist. Calves supported against bed rail in addition to upright physical assist                           ADL either performed or assessed with clinical judgement   ADL Overall ADL's : Needs assistance/impaired                                     Functional mobility during ADLs: Maximal assistance;+2 for physical assistance (2 person HHA for sit to stand only) General ADL Comments: Pt agitated and combative this session. Limited to standing at EOB for urinating with urinal and donning brief. Pt required max assist overall for ADL at this time.     Vision   Additional Comments: Difficult to assess due to impaired cognition     Perception     Praxis      Pertinent Vitals/Pain Pain Assessment: Faces Faces Pain Scale: Hurts little more Pain Location: unknown Pain Descriptors / Indicators: Grimacing Pain Intervention(s): Repositioned     Hand Dominance     Extremity/Trunk Assessment Upper Extremity Assessment Upper Extremity Assessment: Generalized weakness;Difficult to assess  due to impaired cognition (AROM noted in bilateral extremities)   Lower Extremity Assessment Lower Extremity Assessment: Generalized weakness;Difficult to assess due to impaired cognition (AROM noted in bilateral extremities)       Communication Communication Communication: No difficulties   Cognition Arousal/Alertness: Awake/alert Behavior During Therapy:  Restless;Agitated;Impulsive Overall Cognitive Status: History of cognitive impairments - at baseline Area of Impairment: Orientation;Attention;Memory;Following commands;Safety/judgement;Awareness;Problem solving                 Orientation Level: Disoriented to;Person;Place;Time;Situation Current Attention Level: Sustained Memory: Decreased short-term memory Following Commands: Follows one step commands inconsistently;Follows one step commands with increased time Safety/Judgement: Decreased awareness of safety Awareness: Intellectual Problem Solving: Decreased initiation;Requires verbal cues;Requires tactile cues General Comments: Patient very agitated and restless throughout session. Became combative with therpy team punching staff at times.    General Comments       Exercises     Shoulder Instructions      Home Living Family/patient expects to be discharged to:: Private residence Living Arrangements: Other (Comment) (24/7 caregivers) Available Help at Discharge: Personal care attendant;Available 24 hours/day Type of Home: House       Home Layout: One level     Bathroom Shower/Tub: Occupational psychologist: Standard     Home Equipment: Environmental consultant - 2 wheels;Shower seat;Grab bars - toilet;Grab bars - tub/shower          Prior Functioning/Environment Level of Independence: Needs assistance  Gait / Transfers Assistance Needed: walks with RW and very close supervision ADL's / Homemaking Assistance Needed: assist for all ADL. able to self feed without assist            OT Problem List: Decreased strength;Decreased activity tolerance;Impaired balance (sitting and/or standing);Decreased cognition;Decreased safety awareness;Decreased knowledge of use of DME or AE;Decreased knowledge of precautions;Pain;Impaired UE functional use      OT Treatment/Interventions: Self-care/ADL training;Therapeutic exercise;Energy conservation;DME and/or AE  instruction;Therapeutic activities;Cognitive remediation/compensation;Patient/family education;Balance training    OT Goals(Current goals can be found in the care plan section) Acute Rehab OT Goals Patient Stated Goal: none stated OT Goal Formulation: With patient Time For Goal Achievement: 11/10/17 Potential to Achieve Goals: Fair ADL Goals Pt Will Perform Eating: with set-up;with supervision;sitting Pt Will Perform Grooming: with set-up;with supervision;sitting Pt Will Transfer to Toilet: with min guard assist;ambulating;bedside commode Pt Will Perform Toileting - Clothing Manipulation and hygiene: with min guard assist;sit to/from stand Additional ADL Goal #1: Pt will follow one step command 75% of the time with min verbal cues.  OT Frequency: Min 2X/week   Barriers to D/C:            Co-evaluation PT/OT/SLP Co-Evaluation/Treatment: Yes Reason for Co-Treatment: Complexity of the patient's impairments (multi-system involvement);Necessary to address cognition/behavior during functional activity;For patient/therapist safety;To address functional/ADL transfers PT goals addressed during session: Mobility/safety with mobility OT goals addressed during session: ADL's and self-care      AM-PAC PT "6 Clicks" Daily Activity     Outcome Measure Help from another person eating meals?: A Lot Help from another person taking care of personal grooming?: A Lot Help from another person toileting, which includes using toliet, bedpan, or urinal?: A Lot Help from another person bathing (including washing, rinsing, drying)?: A Lot Help from another person to put on and taking off regular upper body clothing?: A Lot Help from another person to put on and taking off regular lower body clothing?: A Lot 6 Click Score: 12   End of Session Equipment Utilized During Treatment: Gait  belt Nurse Communication: Mobility status;Other (comment) (pt with skin tear on L forearm)  Activity Tolerance: Treatment  limited secondary to agitation Patient left: in bed;with call bell/phone within reach;with bed alarm set;with family/visitor present  OT Visit Diagnosis: Unsteadiness on feet (R26.81);Other abnormalities of gait and mobility (R26.89);History of falling (Z91.81);Muscle weakness (generalized) (M62.81)                Time: 4356-8616 OT Time Calculation (min): 20 min Charges:  OT General Charges $OT Visit: 1 Visit OT Evaluation $OT Eval Moderate Complexity: 1 Mod G-Codes: OT G-codes **NOT FOR INPATIENT CLASS** Functional Assessment Tool Used: Clinical judgement Functional Limitation: Self care Self Care Current Status (O3729): At least 80 percent but less than 100 percent impaired, limited or restricted Self Care Goal Status (M2111): At least 40 percent but less than 60 percent impaired, limited or restricted   Mel Almond A. Ulice Brilliant, M.S., OTR/L Pager: [place] 10/27/2017, 2:58 PM

## 2017-10-27 NOTE — Progress Notes (Signed)
Patient is back from MRI  

## 2017-10-27 NOTE — Progress Notes (Signed)
Patient is back without the MRI being done. MRI staff reported patient was agitated and unable to stay still for the test. MD notified.

## 2017-10-27 NOTE — Progress Notes (Addendum)
PROGRESS NOTE    Nicholas Andrews  ERX:[id number] DOB: 1918/11/19 DOA: 10/26/2017 PCP: Leanna Battles, MD   Outpatient Specialists:    Brief Narrative:  Nicholas Andrews is a 83 y.o. male with medical history significant of BPH, Lewy body dementia, Parkinson's who is presenting with above complaints which lasted for less than 2 hours reportedly. Patient is symptom-free per daughter at bedside. Patient has no new particular complaints and is poor historian as he does not remember what occurred.  ED Course:  Patient had CT scan which was negative for acute abnormalities we were consulted for further evaluation recommendations given above complaints. Will admit for TIA workup   Assessment & Plan:   Active Problems:   BPH (benign prostatic hyperplasia)   Hyperkalemia   Stroke-like symptom   TIA (transient ischemic attack)   TIA -symptoms resolved -echo Carotids -FLP: LDL 107-- will not start statin due to age and risks HgbA1c: 5.4 -EKG appears to be a fib-- discussed with cards and most likely rate controlled a fib but EKG difficult to discern-- no clear p waves seen - unable to do MRI due to agitation -high risk of falls with 4-5 falls in last few months while having 24 hour care givers  New a fib -rate control -not sure he is an anticoagulation candidate    BPH (benign prostatic hyperplasia) - continue home medication regimen.    Hyperkalemia - normal  Dementia/parksinon's disease -has 24 hour care giving   DVT prophylaxis:   Code Status: Full Code   Family Communication: Called daughter  Disposition Plan:     Consultants:   neuro  Procedures:     Antimicrobials:      Subjective: Sleepy as given ativan to try to get MRI  Objective: Vitals:   10/27/17 0318 10/27/17 0536 10/27/17 0800 10/27/17 1000  BP: 138/60 (!) 141/64 (!) 149/58 (!) 159/61  Pulse: 81 76 69 71  Resp: 16 16  18   Temp: 98 F (36.7 C) 98 F (36.7 C) 98.6 F (37 C) 98.6  F (37 C)  TempSrc: Oral Oral Axillary Axillary  SpO2: 98% 95%  94%    Intake/Output Summary (Last 24 hours) at 10/27/17 1407 Last data filed at 10/27/17 0500  Gross per 24 hour  Intake            35.83 ml  Output              275 ml  Net          -239.17 ml   There were no vitals filed for this visit.  Examination:  General exam: sleepy Respiratory system: Clear  Cardiovascular system: irr    Data Reviewed: I have personally reviewed following labs and imaging studies  CBC:  Recent Labs Lab 10/26/17 1455 10/26/17 1509  WBC 4.4  --   NEUTROABS 2.7  --   HGB 14.5 14.3  HCT 41.9 42.0  MCV 98.6  --   PLT 156  --    Basic Metabolic Panel:  Recent Labs Lab 10/26/17 1509 10/26/17 1648 10/27/17 0726  NA 138 135 139  K 5.8* 5.5* 3.9  CL 103 103 106  CO2  --  25 25  GLUCOSE 96 109* 94  BUN 36* 23* 17  CREATININE 1.30* 1.25* 1.26*  CALCIUM  --  8.8* 9.1   GFR: CrCl cannot be calculated (Unknown ideal weight.). Liver Function Tests:  Recent Labs Lab 10/26/17 1648  AST 35  ALT 12*  ALKPHOS 73  BILITOT 1.6*  PROT 6.1*  ALBUMIN 3.3*   No results for input(s): LIPASE, AMYLASE in the last 168 hours. No results for input(s): AMMONIA in the last 168 hours. Coagulation Profile:  Recent Labs Lab 10/26/17 1648  INR 1.10   Cardiac Enzymes: No results for input(s): CKTOTAL, CKMB, CKMBINDEX, TROPONINI in the last 168 hours. BNP (last 3 results) No results for input(s): PROBNP in the last 8760 hours. HbA1C:  Recent Labs  10/27/17 0726  HGBA1C 5.4   CBG: No results for input(s): GLUCAP in the last 168 hours. Lipid Profile:  Recent Labs  10/27/17 0726  CHOL 169  HDL 51  LDLCALC 107*  TRIG 54  CHOLHDL 3.3   Thyroid Function Tests: No results for input(s): TSH, T4TOTAL, FREET4, T3FREE, THYROIDAB in the last 72 hours. Anemia Panel: No results for input(s): VITAMINB12, FOLATE, FERRITIN, TIBC, IRON, RETICCTPCT in the last 72 hours. Urine  analysis:    Component Value Date/Time   COLORURINE YELLOW 10/26/2017 1626   APPEARANCEUR CLEAR 10/26/2017 1626   LABSPEC 1.017 10/26/2017 1626   PHURINE 6.0 10/26/2017 1626   GLUCOSEU NEGATIVE 10/26/2017 1626   HGBUR NEGATIVE 10/26/2017 1626   BILIRUBINUR NEGATIVE 10/26/2017 1626   BILIRUBINUR neg 03/05/2015 1646   KETONESUR NEGATIVE 10/26/2017 1626   PROTEINUR NEGATIVE 10/26/2017 1626   UROBILINOGEN 0.2 03/05/2015 1646   NITRITE NEGATIVE 10/26/2017 1626   LEUKOCYTESUR NEGATIVE 10/26/2017 1626     )No results found for this or any previous visit (from the past 240 hour(s)).    Anti-infectives    None       Radiology Studies: Dg Chest 2 View  Result Date: 10/27/2017 CLINICAL DATA:  Stroke-like symptoms EXAM: CHEST  2 VIEW COMPARISON:  01/07/2016 FINDINGS: Mild enlarged cardiac silhouette. Central bronchitic markings. No pulmonary edema. No pneumothorax. No focal consolidation. Elevation of the LEFT hemidiaphragm. IMPRESSION: Chronic bronchitic markings.  No acute findings. Electronically Signed   By: Suzy Bouchard M.D.   On: 10/27/2017 07:28   Ct Head Wo Contrast  Result Date: 10/26/2017 CLINICAL DATA:  Altered level of consciousness with unintelligible speech. EXAM: CT HEAD WITHOUT CONTRAST TECHNIQUE: Contiguous axial images were obtained from the base of the skull through the vertex without intravenous contrast. COMPARISON:  None. FINDINGS: BRAIN: There is sulcal and ventricular prominence consistent with superficial and central atrophy. No intraparenchymal hemorrhage, mass effect nor midline shift. Periventricular and subcortical white matter hypodensities consistent with chronic small vessel ischemic disease are identified. Remote left posterior parietal cortical and subcortical infarct with encephalomalacia. No acute large vascular territory infarcts. No abnormal extra-axial fluid collections. Basal cisterns are not effaced and midline. VASCULAR: Mild to moderate calcific  atherosclerosis of the carotid siphons. No hyperdense vessels. SKULL: No skull fracture. No significant scalp soft tissue swelling. SINUSES/ORBITS: The mastoid air-cells are clear. Mild mucosal thickening of the right maxillary sinus.The included ocular globes and orbital contents are non-suspicious. OTHER: Scattered intravascular dots of air likely iatrogenic within veins of the right cheek and venous plexus of the pituitary fossa. IMPRESSION: 1. No acute intracranial abnormality. Remote left posterior parietal cortical and subcortical infarct. 2. Atrophy with chronic small vessel ischemia. Electronically Signed   By: Ashley Royalty M.D.   On: 10/26/2017 15:34        Scheduled Meds: . aspirin EC  325 mg Oral Daily  . finasteride  5 mg Oral Daily  . heparin  5,000 Units Subcutaneous Q8H  . multivitamin with minerals  1 tablet Oral Daily  . rOPINIRole  1 mg Oral QHS  . tamsulosin  0.4 mg Oral QHS   Continuous Infusions: . sodium chloride 10 mL/hr at 10/27/17 0125     LOS: 1 day    Time spent: 35 min    [place], DO [facility] Pager [phone]  If 7PM-7AM, please contact night-coverage [email or web address] Password [email or web address] 10/27/2017, 2:07 PM

## 2017-10-27 NOTE — Progress Notes (Signed)
  Echocardiogram 2D Echocardiogram has been performed.  Nicholas Andrews 10/27/2017, 11:38 AM

## 2017-10-27 NOTE — Evaluation (Signed)
Physical Therapy Evaluation Patient Details Name: Nicholas Andrews MRN: [id number] DOB: 11-May-1918 Today's Date: 10/27/2017   History of Present Illness  83 y.o.malewith medical history significant of BPH, Louie body dementia, Parkinson's who is presenting with unintelligible speec, left sided facial droop and weakness in left arm and leg. CT on 10/30 positive for Remote left posterior parietal  Clinical Impression  Orders received for PT evaluation. Patient demonstrates deficits in functional mobility as indicated below. Will benefit from continued skilled PT to address deficits and maximize function. Will see as indicated and progress as tolerated.  Patient evaluation significantly limited by patient agitation. Patient requiring 2 person max assist, at times 3 person to assist for functional tasks. Patient extremely confused with noted weakness. Caregiver reports this is not his baseline. At this time, recommending ST SNF as patient is requiring more physical assist then previously required. PAtient unable to ambulate safely at this time.     Follow Up Recommendations SNF;Supervision/Assistance - 24 hour    Equipment Recommendations  None recommended by PT    Recommendations for Other Services       Precautions / Restrictions Precautions Precautions: Fall Restrictions Weight Bearing Restrictions: No      Mobility  Bed Mobility Overal bed mobility: Needs Assistance Bed Mobility: Supine to Sit;Sit to Supine     Supine to sit: Max assist;+2 for physical assistance;+2 for safety/equipment Sit to supine: Max assist;+2 for physical assistance;+2 for safety/equipment   General bed mobility comments: Max assist to power trunk up to upright position and rotate hips to EOB, assist to initiate upright. Max multi modal cues required to assist patient due to agitation and restlessness  Transfers Overall transfer level: Needs assistance Equipment used:  (2 person face to face) Transfers:  Sit to/from Stand Sit to Stand: Max assist;+2 physical assistance         General transfer comment: +2 max assist to power up, moderate assist to maintain standing.  Ambulation/Gait             General Gait Details: unable to perform  Stairs            Wheelchair Mobility    Modified Rankin (Stroke Patients Only)       Balance Overall balance assessment: Needs assistance Sitting-balance support: Feet supported Sitting balance-Leahy Scale: Poor Sitting balance - Comments: min guard to min assist for EOB   Standing balance support: During functional activity Standing balance-Leahy Scale: Poor Standing balance comment: reliant on bilateral UE support, limited ability to maintain upright, required hands on physical assist. [place] supported against bed rail in addition to upright physical assist                             Pertinent Vitals/Pain Pain Assessment: Faces Faces Pain Scale: Hurts little more Pain Location: unknown Pain Descriptors / Indicators: Grimacing Pain Intervention(s): Repositioned    Home Living Family/patient expects to be discharged to:: Private residence Living Arrangements: Other (Comment) (24/7 caregivers) Available Help at Discharge: Personal care attendant;Available 24 hours/day Type of Home: House       Home Layout: One level Home Equipment: [place] - 2 wheels;Shower seat;Grab bars - toilet;Grab bars - tub/shower      Prior Function Level of Independence: Needs assistance   Gait / Transfers Assistance Needed: walks with RW and very close supervision  ADL's / Homemaking Assistance Needed: assist for all ADL. able to self feed without assist  Hand Dominance        Extremity/Trunk Assessment   Upper Extremity Assessment Upper Extremity Assessment: Generalized weakness;Difficult to assess due to impaired cognition (AROM noted in bilateral extremities)    Lower Extremity Assessment Lower Extremity  Assessment: Generalized weakness;Difficult to assess due to impaired cognition (AROM noted in bilateral extremities)       Communication   Communication: No difficulties  Cognition Arousal/Alertness: Awake/alert Behavior During Therapy: Restless;Agitated;Impulsive Overall Cognitive Status: History of cognitive impairments - at baseline Area of Impairment: Orientation;Attention;Memory;Following commands;Safety/judgement;Awareness;Problem solving                 Orientation Level: Disoriented to;Person;Place;Time;Situation Current Attention Level: Sustained Memory: Decreased short-term memory Following Commands: Follows one step commands inconsistently;Follows one step commands with increased time Safety/Judgement: Decreased awareness of safety Awareness: Intellectual Problem Solving: Decreased initiation;Requires verbal cues;Requires tactile cues General Comments: Patient very agitated and restless throughout session. Became combative with therpy team punching staff at times.       General Comments      Exercises     Assessment/Plan    PT Assessment Patient needs continued PT services  PT Problem List Decreased strength;Decreased activity tolerance;Decreased balance;Decreased mobility;Decreased cognition;Decreased safety awareness;Decreased skin integrity       PT Treatment Interventions DME instruction;Gait training;Stair training;Therapeutic activities;Therapeutic exercise;Balance training;Neuromuscular re-education;Patient/family education    PT Goals (Current goals can be found in the Care Plan section)  Acute Rehab PT Goals Patient Stated Goal: none stated PT Goal Formulation: Patient unable to participate in goal setting Time For Goal Achievement: 11/10/17 Potential to Achieve Goals: Poor    Frequency Min 3X/week   Barriers to discharge        Co-evaluation PT/OT/SLP Co-Evaluation/Treatment: Yes Reason for Co-Treatment: Complexity of the patient's  impairments (multi-system involvement);Necessary to address cognition/behavior during functional activity;For patient/therapist safety;To address functional/ADL transfers PT goals addressed during session: Mobility/safety with mobility OT goals addressed during session: ADL's and self-care       AM-PAC PT "6 Clicks" Daily Activity  Outcome Measure Difficulty turning over in bed (including adjusting bedclothes, sheets and blankets)?: Unable Difficulty moving from lying on back to sitting on the side of the bed? : Unable Difficulty sitting down on and standing up from a chair with arms (e.g., wheelchair, bedside commode, etc,.)?: Unable Help needed moving to and from a bed to chair (including a wheelchair)?: Total Help needed walking in hospital room?: Total Help needed climbing 3-5 steps with a railing? : Total 6 Click Score: 6    End of Session Equipment Utilized During Treatment:  (attempted use of gait belt) Activity Tolerance: Treatment limited secondary to agitation Patient left: in bed;with call bell/phone within reach;with bed alarm set;with family/visitor present Nurse Communication: Mobility status PT Visit Diagnosis: Muscle weakness (generalized) (M62.81);Difficulty in walking, not elsewhere classified (R26.2)    Time: 1201-1224 PT Time Calculation (min) (ACUTE ONLY): 23 min   Charges:   PT Evaluation $PT Eval Moderate Complexity: 1 Mod     PT G Codes:        Alben Deeds, PT DPT  Board Certified Neurologic Specialist [place] 10/27/2017, 2:51 PM

## 2017-10-27 NOTE — Consult Note (Signed)
Requesting Physician: Dr. Eliseo Squires    Chief Complaint: TIA  History obtained from:  Patient   Chart    HPI:                                                                                                                                         80 of the history and physical was obtained from the chart as the patient has significant dementia and Parkinson's.  Nicholas Andrews is an 83 y.o. male who apparently was home with a CNA caregiver and upon waking up from a nap was noted to have unintelligible speech and left-sided facial droop along with weakness of his left leg and left arm.  EMS was called and by the time they arrived patient was having symptoms resolved.  On arrival to the ED patients symptoms had fully resolved.  Per chart the total amount of time that the symptoms were present were approximately 2 hours.  Patient's head CT was negative and patient is unwilling to take MRI.  At home patient does take aspirin daily.  Patient's LDL was 107, A1c is 5.4, echocardiogram has been finished however the reading translated into epic  And he is due for ultrasound of the carotids.  Patient is a significant fall risk and has fallen multiple times secondary to a shuffling gait due to his history of Parkinson's.  Neurology was asked to see the patient to make recommendations on anticoagulation versus antiplatelet therapy given patient's new TIA symptoms and possible A. fib which is new at this time.    Patient's caregiver is in the room with him.  She states that he walks with a walker but not a cane.  They usually have to walk behind him as he is a fall risk.  He does not fall significantly but he does fall often.   Date last known well: Date: 10/26/2017 Time last known well: Unable to determine tPA Given: No: symptoms resolved Modified Rankin: Rankin Score=4    Past Medical History:  Diagnosis Date  . Acute blood loss anemia   . BPH (benign prostatic hyperplasia)   . Cataract   . CKD (chronic  kidney disease), stage III ([place])   . Closed displaced fracture of right femoral neck with routine healing   . H/O tongue cancer   . Lewy body dementia   . Parkinson's syndrome ([place])   . Protein calorie malnutrition ([place])   . Unsteady gait     Past Surgical History:  Procedure Laterality Date  . APPENDECTOMY    . EYE SURGERY    . HIP ARTHROPLASTY Right 01/08/2016   Procedure: Right Hip Hemiarthroplasty;  Surgeon: Marybelle Killings, MD;  Location: [facility]S;  Service: Orthopedics;  Laterality: Right;  . TONSILLECTOMY AND ADENOIDECTOMY    . TRANSURETHRAL RESECTION OF PROSTATE      Family History  Problem Relation Age of Onset  . Dementia  Mother    Social History:  reports that he has quit smoking. He does not have any smokeless tobacco history on file. He reports that he does not drink alcohol or use drugs.  Allergies: No Known Allergies  Medications:                                                                                                                           Current Facility-Administered Medications  Medication Dose Route Frequency Provider Last Rate Last Dose  . 0.9 %  sodium chloride infusion   Intravenous Continuous Velvet Bathe, MD 10 mL/hr at 10/27/17 0125    . acetaminophen (TYLENOL) tablet 650 mg  650 mg Oral Q4H PRN Velvet Bathe, MD       Or  . acetaminophen (TYLENOL) solution 650 mg  650 mg Per Tube Q4H PRN Velvet Bathe, MD       Or  . acetaminophen (TYLENOL) suppository 650 mg  650 mg Rectal Q4H PRN Velvet Bathe, MD      . aspirin EC tablet 325 mg  325 mg Oral Daily Velvet Bathe, MD      . finasteride (PROSCAR) tablet 5 mg  5 mg Oral Daily Velvet Bathe, MD      . heparin injection 5,000 Units  5,000 Units Subcutaneous Q8H Velvet Bathe, MD      . multivitamin with minerals tablet 1 tablet  1 tablet Oral Daily Velvet Bathe, MD      . rOPINIRole (REQUIP) tablet 1 mg  1 mg Oral QHS Velvet Bathe, MD      . senna-docusate (Senokot-S) tablet 1 tablet  1 tablet  Oral QHS PRN Velvet Bathe, MD      . tamsulosin (FLOMAX) capsule 0.4 mg  0.4 mg Oral QHS Velvet Bathe, MD         ROS:                                                                                                                                       History obtained from unobtainable from patient due to mental status   Neurologic Examination:  Blood pressure (!) 159/61, pulse 71, temperature 98.6 F (37 C), temperature source Axillary, resp. rate 18, SpO2 94 %.  HEENT-  Normocephalic, no lesions, without obvious abnormality.  Normal external eye and conjunctiva.  Normal TM's bilaterally.  Normal auditory canals and external ears. Normal external nose, mucus membranes and septum.  Normal pharynx. Cardiovascular- regular rate and rhythm, pulses palpable throughout   Lungs- chest clear, no wheezing, rales, normal symmetric air entry, Heart exam - S1, S2 normal, no murmur, no gallop, rate regular Abdomen- normal findings: bowel sounds normal Extremities- no edema Lymph-no adenopathy palpable Musculoskeletal-no joint tenderness, deformity or swelling Skin-warm and dry, no hyperpigmentation, vitiligo, or suspicious lesions  Neurological Examination Mental Status: Alert, not oriented.  Speech fluent without evidence of aphasia and minimal.  Able to follow very simple commands such as lifting his legs and showing his thumb. Cranial Nerves: II:  Visual fields grossly normal,  III,IV, VI: ptosis not present, extra-ocular motions intact bilaterally, pupils equal, round, reactive to light and accommodation V,VII: smile symmetric, facial light touch sensation normal bilaterally VIII: hearing normal bilaterally IX,X: uvula rises symmetrically XI: bilateral shoulder shrug XII: midline tongue extension Motor: Right : Upper extremity   4/5    Left:     Upper extremity   4/5  Lower extremity    4/5     Lower extremity   4/5 Increased tone throughout with cogwheeling.  Patient does not have a resting tremor that is noted at this time. Sensory: Pinprick and light touch intact throughout, bilaterally Deep Tendon Reflexes: 1+ and symmetric throughout Plantars: Right: downgoing   Left: downgoing Cerebellar: normal finger-to-nose, Gait: Not tested       Lab Results: Basic Metabolic Panel:  Recent Labs Lab 10/26/17 1509 10/26/17 1648 10/27/17 0726  NA 138 135 139  K 5.8* 5.5* 3.9  CL 103 103 106  CO2  --  25 25  GLUCOSE 96 109* 94  BUN 36* 23* 17  CREATININE 1.30* 1.25* 1.26*  CALCIUM  --  8.8* 9.1    Liver Function Tests:  Recent Labs Lab 10/26/17 1648  AST 35  ALT 12*  ALKPHOS 73  BILITOT 1.6*  PROT 6.1*  ALBUMIN 3.3*   No results for input(s): LIPASE, AMYLASE in the last 168 hours. No results for input(s): AMMONIA in the last 168 hours.  CBC:  Recent Labs Lab 10/26/17 1455 10/26/17 1509  WBC 4.4  --   NEUTROABS 2.7  --   HGB 14.5 14.3  HCT 41.9 42.0  MCV 98.6  --   PLT 156  --     Cardiac Enzymes: No results for input(s): CKTOTAL, CKMB, CKMBINDEX, TROPONINI in the last 168 hours.  Lipid Panel:  Recent Labs Lab 10/27/17 0726  CHOL 169  TRIG 54  HDL 51  CHOLHDL 3.3  VLDL 11  LDLCALC 107*    CBG: No results for input(s): GLUCAP in the last 168 hours.  Microbiology: Results for orders placed or performed during the hospital encounter of 01/07/16  Surgical pcr screen     Status: None   Collection Time: 01/08/16  1:50 PM  Result Value Ref Range Status   MRSA, PCR NEGATIVE NEGATIVE Final   Staphylococcus aureus NEGATIVE NEGATIVE Final    Comment:        The Xpert SA Assay (FDA approved for NASAL specimens in patients over 48 years of age), is one component of a comprehensive surveillance program.  Test performance has been validated by [facility] for patients  greater than or equal to 66 year old. It is not intended to  diagnose infection nor to guide or monitor treatment.     Coagulation Studies:  Recent Labs  10/26/17 1648  LABPROT 14.1  INR 1.10    Imaging: Dg Chest 2 View  Result Date: 10/27/2017 CLINICAL DATA:  Stroke-like symptoms EXAM: CHEST  2 VIEW COMPARISON:  01/07/2016 FINDINGS: Mild enlarged cardiac silhouette. Central bronchitic markings. No pulmonary edema. No pneumothorax. No focal consolidation. Elevation of the LEFT hemidiaphragm. IMPRESSION: Chronic bronchitic markings.  No acute findings. Electronically Signed   By: Suzy Bouchard M.D.   On: 10/27/2017 07:28   Ct Head Wo Contrast  Result Date: 10/26/2017 CLINICAL DATA:  Altered level of consciousness with unintelligible speech. EXAM: CT HEAD WITHOUT CONTRAST TECHNIQUE: Contiguous axial images were obtained from the base of the skull through the vertex without intravenous contrast. COMPARISON:  None. FINDINGS: BRAIN: There is sulcal and ventricular prominence consistent with superficial and central atrophy. No intraparenchymal hemorrhage, mass effect nor midline shift. Periventricular and subcortical white matter hypodensities consistent with chronic small vessel ischemic disease are identified. Remote left posterior parietal cortical and subcortical infarct with encephalomalacia. No acute large vascular territory infarcts. No abnormal extra-axial fluid collections. Basal cisterns are not effaced and midline. VASCULAR: Mild to moderate calcific atherosclerosis of the carotid siphons. No hyperdense vessels. SKULL: No skull fracture. No significant scalp soft tissue swelling. SINUSES/ORBITS: The mastoid air-cells are clear. Mild mucosal thickening of the right maxillary sinus.The included ocular globes and orbital contents are non-suspicious. OTHER: Scattered intravascular dots of air likely iatrogenic within veins of the right cheek and venous plexus of the pituitary fossa. IMPRESSION: 1. No acute intracranial abnormality. Remote left  posterior parietal cortical and subcortical infarct. 2. Atrophy with chronic small vessel ischemia. Electronically Signed   By: Ashley Royalty M.D.   On: 10/26/2017 15:34   Assessment and plan discussed with with attending physician and they are in agreement.    Etta Quill PA-C [facility] [phone]  10/27/2017, 12:00 PM Attending Neurohospitalist Addendum Patient seen and examined. Agree with the history and physical as documented above. Agree with the plan as documented, which I helped formulate. I have independently obtaining history, and review of systems on my exam and the patient and reviewed the chart including pertinent labs and pertinent imaging.  Stroke workup LDL 107 A1c 5.4 Echo-LVEF 60-65%, calcified aortic valve leaflets, mildly dilated left atrium, right atrium upper limits of normal Carotid Dopplers 1-39% stenosis of the right and left ICA.  Vertebral arteries not visualized bilaterally due to poor positioning.   Assessment: 83 y.o. male resented to the hospital with transient symptoms of a aphasia and left facial droop which resolved within 2 hours.  Upon arriving at the hospital EKG showed A. fib however on telemetry he is not in A. fib.  At this time patient may be suffering from paroxysmal atrial fibrillation which might have caused the TIA.  Primary hospitalist does not believe that the patient is a good candidate for MRI based on his Parkinson's, dementia and inability to lay flat. Primary team is consulted neurology for recommendations on anticoagulation with a patient who has Parkinson's disease and is a high fall risk.  Stroke Risk Factors - none   Impression Evaluate for stroke/TIA Evaluate for new found paroxysmal atrial fibrillation Parkinson's Lewy body dementia Gait instability  Recommend: --Consider repeat CT head or try MRI to establish a diagnosis of stroke.  --Continue aspirin --As most of the stroke  workup has been finished will have  stroke [place] in the morning and make recommendations if anticoagulation is recommended with a person who has significant fall risk and if so what recommendations. --I would personally favor antiplatelets only given his advanced age and poor baseline functional status along with him being a high fall risk.  --For his Parkinson's, I would not recommend any changes be made to his home regimen of medications.  Amie Portland, MD [facility] [phone] If 7pm to 7am, please call on call as listed on [facility]ION.

## 2017-10-27 NOTE — Progress Notes (Signed)
Patient transported back to MRI.   

## 2017-10-27 NOTE — Progress Notes (Signed)
Carotid duplex complete, please see CV Proc or Results Review tab for preliminary results.  Lita Mains- RDMS, RVT 3:50 PM  10/27/2017

## 2017-10-27 NOTE — Progress Notes (Signed)
Patient transported to MRI 

## 2017-10-27 NOTE — Progress Notes (Signed)
PT Cancellation Note  Patient Details Name: MARGIE BRINK MRN: [id number] DOB: 02-07-1918   Cancelled Treatment:    Reason Eval/Treat Not Completed: Fatigue/lethargy limiting ability to participate, patient sleeping will re-attempt.   Duncan Dull 10/27/2017, 9:39 AM Alben Deeds, PT DPT  Board Certified Neurologic Specialist [phone]

## 2017-10-28 DIAGNOSIS — G459 Transient cerebral ischemic attack, unspecified: Principal | ICD-10-CM

## 2017-10-28 DIAGNOSIS — I4891 Unspecified atrial fibrillation: Secondary | ICD-10-CM

## 2017-10-28 MED ORDER — ATORVASTATIN CALCIUM 40 MG PO TABS: 40.0000 mg | ORAL_TABLET | Freq: Every day | ORAL | 0 refills | Status: DC

## 2017-10-28 MED ORDER — ATORVASTATIN CALCIUM 40 MG PO TABS
40.0000 mg | ORAL_TABLET | Freq: Every day | ORAL | Status: DC
Start: 2017-10-28 — End: 2017-10-28

## 2017-10-28 NOTE — Progress Notes (Signed)
Pt discharging home at this time with family taking all personal belongings. IV discontinued, dry dressing applied. Discharge instructions provided with prescription with verbal understanding. Family provides 24/7 care. Daughter will make follow up appts per dc summary. No noted distress.

## 2017-10-28 NOTE — Progress Notes (Signed)
STROKE TEAM PROGRESS NOTE   SUBJECTIVE (INTERVAL HISTORY) Sitter from home is at the bedside. Reviews course of events yesterday. No family at bedside.  Overall she feels the patients condition is gradually improving.  Voices no new complaints. No new events reported overnight.  OBJECTIVE No results for input(s): GLUCAP in the last 168 hours.  Recent Labs Lab 10/26/17 1509 10/26/17 1648 10/27/17 0726  NA 138 135 139  K 5.8* 5.5* 3.9  CL 103 103 106  CO2  --  25 25  GLUCOSE 96 109* 94  BUN 36* 23* 17  CREATININE 1.30* 1.25* 1.26*  CALCIUM  --  8.8* 9.1    Recent Labs Lab 10/26/17 1648  AST 35  ALT 12*  ALKPHOS 73  BILITOT 1.6*  PROT 6.1*  ALBUMIN 3.3*    Recent Labs Lab 10/26/17 1455 10/26/17 1509  WBC 4.4  --   NEUTROABS 2.7  --   HGB 14.5 14.3  HCT 41.9 42.0  MCV 98.6  --   PLT 156  --    No results for input(s): CKTOTAL, CKMB, CKMBINDEX, TROPONINI in the last 168 hours.  Recent Labs  10/26/17 1648  LABPROT 14.1  INR 1.10    Recent Labs  10/26/17 1626  COLORURINE YELLOW  LABSPEC 1.017  PHURINE 6.0  GLUCOSEU NEGATIVE  HGBUR NEGATIVE  BILIRUBINUR NEGATIVE  KETONESUR NEGATIVE  PROTEINUR NEGATIVE  NITRITE NEGATIVE  LEUKOCYTESUR NEGATIVE       Component Value Date/Time   CHOL 169 10/27/2017 0726   TRIG 54 10/27/2017 0726   HDL 51 10/27/2017 0726   CHOLHDL 3.3 10/27/2017 0726   VLDL 11 10/27/2017 0726   LDLCALC 107 (H) 10/27/2017 0726   Lab Results  Component Value Date   HGBA1C 5.4 10/27/2017      Component Value Date/Time   LABOPIA NONE DETECTED 10/26/2017 1626   COCAINSCRNUR NONE DETECTED 10/26/2017 1626   LABBENZ NONE DETECTED 10/26/2017 1626   AMPHETMU NONE DETECTED 10/26/2017 1626   THCU NONE DETECTED 10/26/2017 1626   LABBARB NONE DETECTED 10/26/2017 1626     Recent Labs Lab 10/26/17 1648  ETH <10    IMAGING: I have personally reviewed the radiological images below and agree with the radiology interpretations.  Dg  Chest 2 View  Result Date: 10/27/2017 CLINICAL DATA:  Stroke-like symptoms EXAM: CHEST  2 VIEW COMPARISON:  01/07/2016 FINDINGS: Mild enlarged cardiac silhouette. Central bronchitic markings. No pulmonary edema. No pneumothorax. No focal consolidation. Elevation of the LEFT hemidiaphragm. IMPRESSION: Chronic bronchitic markings.  No acute findings. Electronically Signed   By: Suzy Bouchard M.D.   On: 10/27/2017 07:28   Ct Head Wo Contrast  Result Date: 10/26/2017 CLINICAL DATA:  Altered level of consciousness with unintelligible speech. EXAM: CT HEAD WITHOUT CONTRAST TECHNIQUE: Contiguous axial images were obtained from the base of the skull through the vertex without intravenous contrast. COMPARISON:  None. FINDINGS: BRAIN: There is sulcal and ventricular prominence consistent with superficial and central atrophy. No intraparenchymal hemorrhage, mass effect nor midline shift. Periventricular and subcortical white matter hypodensities consistent with chronic small vessel ischemic disease are identified. Remote left posterior parietal cortical and subcortical infarct with encephalomalacia. No acute large vascular territory infarcts. No abnormal extra-axial fluid collections. Basal cisterns are not effaced and midline. VASCULAR: Mild to moderate calcific atherosclerosis of the carotid siphons. No hyperdense vessels. SKULL: No skull fracture. No significant scalp soft tissue swelling. SINUSES/ORBITS: The mastoid air-cells are clear. Mild mucosal thickening of the right maxillary sinus.The included ocular globes  and orbital contents are non-suspicious. OTHER: Scattered intravascular dots of air likely iatrogenic within veins of the right cheek and venous plexus of the pituitary fossa. IMPRESSION: 1. No acute intracranial abnormality. Remote left posterior parietal cortical and subcortical infarct. 2. Atrophy with chronic small vessel ischemia. Electronically Signed   By: Ashley Royalty M.D.   On: 10/26/2017  15:34    PHYSICAL EXAM Temp:  [97.5 F (36.4 C)-97.9 F (36.6 C)] 97.9 F (36.6 C) (11/01 1204) Pulse Rate:  [52-69] 59 (11/01 1204) Resp:  [16-18] 16 (11/01 1204) BP: (133-157)/(69-76) 142/69 (11/01 1204) SpO2:  [95 %-100 %] 98 % (11/01 1204)  General -frail malnourished looking elderly male in no apparent distress Respiratory - Lungs clear bilaterally. No wheezing. Cardiovascular - Regular rate and rhythm with no murmur  Neurological Examination -  Mental Status: Sleepy, briefly awakes and speaks a few words to bedside aid, not examiner  Speech fluent without evidence of aphasia and minimal.  Able to follow very simple commands such as lifting his legs and showing his thumb. This appears to be his baseline per bedside aide report. Cranial Nerves: Not cooperative with testing Motor:Not cooperative with testing. No obvious weakness noted.  Able to move all 4 extremities equally against gravity. Sensory: light touch intact throughout, bilaterally Cerebellar: Not cooperative with testing Gait: Not tested   ASSESSMENT AND PLAN: [name] is a 83 y.o. male with PMH of Parkinson's,Dementia and newly diagnosed AFIB who was admitted for transient symptoms of a aphasia and left facial droop which resolved within 2 hours of admission.  Upon arriving at the hospital EKG showed A. fib however on telemetry he is not in A. fib.    TIA likely secondary to AFIB:   Resultant:  All symptoms have resolved on exam today   2D Echo:  Impressions: - Technically difficult study. Definity contrast not administered.   LVEF 60-65%, calcified aortic leaflets with trace to mild AI, no   obvious AS, top normal aortic root at 3.8 cm, mild MR, mild LAE,   upper normal RA size, trivial TR, RVSP 36 mmHg, IVC was not   adequately visualized, no pericardial effusion.    B/L Carotid Duplex:  Right Carotid: There is evidence in the right ICA of a 1-39% stenosis. Short                segment of CCA  evaluated due to poor patient positioning and                irritability. Left Carotid: There is evidence in the left ICA of a 1-39% stenosis. Short               segment of CCA evaluated due to poor patient positioning and               irritability. Vertebrals: Bilateral vertebral arteries were not visualized. Poor patient             positioning.   VTE prophylaxis: SCD's  Diet Heart Room service appropriate? Yes; Fluid consistency: Thin  Diet - low sodium heart healthy    aspirin 81 mg daily prior to admission, now on aspirin 325 mg daily.       Please discharge patient on  aspirin 325 mg daily        Therapy recommendations:  [place]       Disposition: HOME           Recommendations:   Ongoing  aggressive stroke risk factor management        Follow up Appointments:    PCP follow up   Follow up with [facility], Dr Leonie Man in 6 weeks  AFIB:  Not on anticoagulation due to high risk of falling with Parkinson's and Dementia. Increased ASA from 81 mg to 325 mg daily Diabetes:  HgbA1c  5.4        goal < 7.0  Controlled  Hypertension: Stable Permissive hypertension (OK if <220/120) for 24-48 hours post stroke and then gradually normalized within 5-7 days.  Long term BP goal normotensive. May slowly restart home B/P medications after 48 hours with close PCP follow up.  Hyperlipidemia:  Home meds:  NONE  LDL    107, goal < 70  Now on  Lipitor to 40 mg daily  Continue statin at discharge  Other Stroke Risk Factors:  Advanced age  Other Active Problems:  Parkinson's  [facility] day # 1   Renie Ora Stroke Neurology Team 10/28/2017 3:46 PM I have personally examined this patient, reviewed notes, independently viewed imaging studies, participated in medical decision making and plan of care.ROS completed by me personally and pertinent positives fully documented  I have made any additions or  clarifications directly to the above note. Agree with note above.  He presented with transient neurological symptoms in the setting of atrial fibrillation and is on aspirin.  He presents high risk for bleeding on anticoagulation given his advanced age and fall risk hence recommend change aspirin dose from 81 to 325 mg.  Transfer back to skilled nursing facility if bed available.  Greater than 50% time during this 25-minute visit was spent on counseling and coordination of care about his TIA, atrial fibrillation and planning treatment.  Discussed with Dr. Theresa Duty, MD Medical Director [facility] Pager: [phone] 10/28/2017 6:51 PM  Neurology to delist at this time. Please call with any further questions or concerns. Thank you for this consultation.  To contact Stroke Continuity provider, please refer to [email or web address] After hours, contact General Neurology

## 2017-10-28 NOTE — Care Management Obs Status (Signed)
[facility] NOTIFICATION   Patient Details  Name: Nicholas Andrews MRN: [id number] Date of Birth: [birth year]   Medicare Observation Status Notification Given:  Yes    Pollie Friar, RN 10/28/2017, 8:06 AM

## 2017-10-28 NOTE — Care Management Note (Signed)
Case Management Note  Patient Details  Name: Nicholas Andrews MRN: [id number] Date of Birth: 1918-04-03  Subjective/Objective:                    Action/Plan: Pt discharging home with resumption of [facility] services. Pt was active with [facility] prior to admission for [facility] PT. Pt in observation status and does not need resumption orders. Mary with [facility] aware of admission and d/c home today. Daughter requesting [facility] home. CM called and arranged for transportation with [facility]. Form on the front of the chart and bedside RN updated.   Expected Discharge Date:  10/28/17               Expected Discharge Plan:  [place]  In-House Referral:     Discharge planning Services  CM Consult  Post Acute Care Choice:  Home Health, Resumption of Svcs/PTA Provider Choice offered to:  Adult Children  DME Arranged:    DME Agency:     HH Arranged:  PT HH Agency:  [facility] (now [facility])  Status of Service:  Completed, signed off  If discussed at [place] of Stay Meetings, dates discussed:    Additional Comments:  Pollie Friar, RN 10/28/2017, 2:41 PM

## 2017-10-28 NOTE — Care Management CC44 (Signed)
Condition Code 44 Documentation Completed  Patient Details  Name: Nicholas Andrews MRN: [id number] Date of Birth: Apr 08, 1918   Condition Code 44 given:  Yes Patient signature on Condition Code 44 notice:  Yes Documentation of 2 MD's agreement:  Yes Code 44 added to claim:  Yes    Pollie Friar, RN 10/28/2017, 8:06 AM

## 2017-10-28 NOTE — Progress Notes (Signed)
Pt non cooperative with keeping tele on. Private sitter and family in room unable to redirect. NP [place] notified. Placed on standby and [place] notified. Will monitor patient.

## 2017-10-28 NOTE — Discharge Summary (Signed)
Physician Discharge Summary  Nicholas Andrews QMG:[id number] DOB: July 09, 1918 DOA: 10/26/2017  PCP: Leanna Battles, MD  Admit date: 10/26/2017 Discharge date: 10/28/2017   Recommendations for Outpatient Follow-Up:   1. Home health 2. Monitor HR- appears to be new a fib   Discharge Diagnosis:   Active Problems:   BPH (benign prostatic hyperplasia)   Hyperkalemia   Stroke-like symptom   TIA (transient ischemic attack)   Discharge disposition:  Home.  Discharge Condition: Improved.  Diet recommendation: Low sodium, heart healthy  Wound care: None.   History of Present Illness:   Nicholas Andrews is a 83 y.o. male with medical history significant of BPH, Louie body dementia, Parkinson's who is presenting with above complaints which lasted for less than 2 hours reportedly. Patient is symptom-free per daughter at bedside. Patient has no new particular complaints and is poor historian as he does not remember what occurred.     Hospital Course by Problem:   TIA likely secondary to AFIB:  2D Echo:  Impressions: - Technically difficult study. Definity contrast not administered. LVEF 60-65%, calcified aortic leaflets with trace to mild AI, no obvious AS, top normal aortic root at 3.8 cm, mild MR, mild LAE, upper normal RA size, trivial TR, RVSP 36 mmHg, IVC was not adequately visualized, no pericardial effusion.   B/L Carotid Duplex:  Right Carotid: There is evidence in the right ICA of a 1-39% stenosis. Left Carotid: There is evidence in the left ICA of a 1-39% stenosis. S Vertebrals: Bilateral vertebral arteries were not visualized. Poor patient positioning.  aspirin 81 mg daily prior to admission, now on aspirin 325 mg daily.       Please discharge patient on  aspirin 325 mg daily  Follow up with [facility], Dr Leonie Man in 6 weeks - HgbA1c  5.4        goal < 7.0  AFIB:  Not on anticoagulation due to high risk of falling with Parkinson's and  Dementia. Increased ASA from 81 mg to 325 mg daily  Discussed EKG with cardiology: appears to be rate controlled a fib   Hypertension:  Stable  Hyperlipidemia:  Home meds:  NONE  LDL    107, goal < 70  Now on  Lipitor to 40 mg daily   Parkinson's Dementia -family refused SNF -home health -family to increase 24 hour care giving    Medical Consultants:    Neuro   Discharge Exam:   Vitals:   10/28/17 0845 10/28/17 1204  BP: 138/76 (!) 142/69  Pulse: 69 (!) 59  Resp: 16 16  Temp: (!) 97.5 F (36.4 C) 97.9 F (36.6 C)  SpO2: 97% 98%   Vitals:   10/27/17 1812 10/28/17 0544 10/28/17 0845 10/28/17 1204  BP: (!) 157/73 133/74 138/76 (!) 142/69  Pulse: (!) 52 (!) 55 69 (!) 59  Resp: 18 18 16 16   Temp: 97.8 F (36.6 C)  (!) 97.5 F (36.4 C) 97.9 F (36.6 C)  TempSrc: Oral  Axillary Axillary  SpO2: 100% 95% 97% 98%    Gen:  NAD    The results of significant diagnostics from this hospitalization (including imaging, microbiology, ancillary and laboratory) are listed below for reference.     Procedures and Diagnostic Studies:   Dg Chest 2 View  Result Date: 10/27/2017 CLINICAL DATA:  Stroke-like symptoms EXAM: CHEST  2 VIEW COMPARISON:  01/07/2016 FINDINGS: Mild enlarged cardiac silhouette. Central bronchitic markings. No pulmonary edema. No pneumothorax. No focal consolidation. Elevation  of the LEFT hemidiaphragm. IMPRESSION: Chronic bronchitic markings.  No acute findings. Electronically Signed   By: Suzy Bouchard M.D.   On: 10/27/2017 07:28   Ct Head Wo Contrast  Result Date: 10/26/2017 CLINICAL DATA:  Altered level of consciousness with unintelligible speech. EXAM: CT HEAD WITHOUT CONTRAST TECHNIQUE: Contiguous axial images were obtained from the base of the skull through the vertex without intravenous contrast. COMPARISON:  None. FINDINGS: BRAIN: There is sulcal and ventricular prominence consistent with superficial and central atrophy. No  intraparenchymal hemorrhage, mass effect nor midline shift. Periventricular and subcortical white matter hypodensities consistent with chronic small vessel ischemic disease are identified. Remote left posterior parietal cortical and subcortical infarct with encephalomalacia. No acute large vascular territory infarcts. No abnormal extra-axial fluid collections. Basal cisterns are not effaced and midline. VASCULAR: Mild to moderate calcific atherosclerosis of the carotid siphons. No hyperdense vessels. SKULL: No skull fracture. No significant scalp soft tissue swelling. SINUSES/ORBITS: The mastoid air-cells are clear. Mild mucosal thickening of the right maxillary sinus.The included ocular globes and orbital contents are non-suspicious. OTHER: Scattered intravascular dots of air likely iatrogenic within veins of the right cheek and venous plexus of the pituitary fossa. IMPRESSION: 1. No acute intracranial abnormality. Remote left posterior parietal cortical and subcortical infarct. 2. Atrophy with chronic small vessel ischemia. Electronically Signed   By: Ashley Royalty M.D.   On: 10/26/2017 15:34     Labs:   Basic Metabolic Panel:  Recent Labs Lab 10/26/17 1509 10/26/17 1648 10/27/17 0726  NA 138 135 139  K 5.8* 5.5* 3.9  CL 103 103 106  CO2  --  25 25  GLUCOSE 96 109* 94  BUN 36* 23* 17  CREATININE 1.30* 1.25* 1.26*  CALCIUM  --  8.8* 9.1   GFR CrCl cannot be calculated (Unknown ideal weight.). Liver Function Tests:  Recent Labs Lab 10/26/17 1648  AST 35  ALT 12*  ALKPHOS 73  BILITOT 1.6*  PROT 6.1*  ALBUMIN 3.3*   No results for input(s): LIPASE, AMYLASE in the last 168 hours. No results for input(s): AMMONIA in the last 168 hours. Coagulation profile  Recent Labs Lab 10/26/17 1648  INR 1.10    CBC:  Recent Labs Lab 10/26/17 1455 10/26/17 1509  WBC 4.4  --   NEUTROABS 2.7  --   HGB 14.5 14.3  HCT 41.9 42.0  MCV 98.6  --   PLT 156  --    Cardiac Enzymes: No  results for input(s): CKTOTAL, CKMB, CKMBINDEX, TROPONINI in the last 168 hours. BNP: Invalid input(s): POCBNP CBG: No results for input(s): GLUCAP in the last 168 hours. D-Dimer No results for input(s): DDIMER in the last 72 hours. Hgb A1c  Recent Labs  10/27/17 0726  HGBA1C 5.4   Lipid Profile  Recent Labs  10/27/17 0726  CHOL 169  HDL 51  LDLCALC 107*  TRIG 54  CHOLHDL 3.3   Thyroid function studies No results for input(s): TSH, T4TOTAL, T3FREE, THYROIDAB in the last 72 hours.  Invalid input(s): FREET3 Anemia work up No results for input(s): VITAMINB12, FOLATE, FERRITIN, TIBC, IRON, RETICCTPCT in the last 72 hours. Microbiology No results found for this or any previous visit (from the past 240 hour(s)).   Discharge Instructions:   Discharge Instructions    Diet - low sodium heart healthy    Complete by:  As directed    Discharge instructions    Complete by:  As directed    24/7 caregiving   Increase activity  slowly    Complete by:  As directed      Allergies as of 10/28/2017   No Known Allergies     Medication List    TAKE these medications   aspirin EC 325 MG tablet Take 1 tablet (325 mg total) by mouth daily. What changed:  Another medication with the same name was removed. Continue taking this medication, and follow the directions you see here.   finasteride 5 MG tablet Commonly known as:  PROSCAR Take 5 mg by mouth daily.   multivitamin with minerals Tabs tablet Take 1 tablet by mouth daily.   rOPINIRole 1 MG tablet Commonly known as:  REQUIP Take 1 mg by mouth at bedtime.   tamsulosin 0.4 MG Caps capsule Commonly known as:  FLOMAX Take 0.4 mg by mouth at bedtime.   triamcinolone cream 0.1 % Commonly known as:  KENALOG Apply 1 application topically daily as needed.      Follow-up Information    Leanna Battles, MD Follow up in 1 week(s).   Specialty:  Internal Medicine Contact information: [address] [place]  [address] [phone]            Time coordinating discharge: 35 min  Signed:  Lamark Schue Alison Stalling   [facility] 10/28/2017, 1:08 PM

## 2017-10-28 NOTE — Progress Notes (Signed)
Pt continues to refuse telemetry monitoring. CCMD made aware. NP was notified per night nurse. Pt with no noted distress. Will continue to monitor.

## 2017-11-01 DIAGNOSIS — Z9181 History of falling: Secondary | ICD-10-CM | POA: Diagnosis not present

## 2017-11-01 DIAGNOSIS — M1990 Unspecified osteoarthritis, unspecified site: Secondary | ICD-10-CM | POA: Diagnosis not present

## 2017-11-01 DIAGNOSIS — I872 Venous insufficiency (chronic) (peripheral): Secondary | ICD-10-CM | POA: Diagnosis not present

## 2017-11-01 DIAGNOSIS — N400 Enlarged prostate without lower urinary tract symptoms: Secondary | ICD-10-CM | POA: Diagnosis not present

## 2017-11-01 DIAGNOSIS — G20 Parkinson's disease: Secondary | ICD-10-CM | POA: Diagnosis not present

## 2017-11-01 DIAGNOSIS — I10 Essential (primary) hypertension: Secondary | ICD-10-CM | POA: Diagnosis not present

## 2017-11-03 DIAGNOSIS — M1990 Unspecified osteoarthritis, unspecified site: Secondary | ICD-10-CM | POA: Diagnosis not present

## 2017-11-03 DIAGNOSIS — G20 Parkinson's disease: Secondary | ICD-10-CM | POA: Diagnosis not present

## 2017-11-03 DIAGNOSIS — I872 Venous insufficiency (chronic) (peripheral): Secondary | ICD-10-CM | POA: Diagnosis not present

## 2017-11-03 DIAGNOSIS — Z9181 History of falling: Secondary | ICD-10-CM | POA: Diagnosis not present

## 2017-11-03 DIAGNOSIS — I10 Essential (primary) hypertension: Secondary | ICD-10-CM | POA: Diagnosis not present

## 2017-11-03 DIAGNOSIS — N400 Enlarged prostate without lower urinary tract symptoms: Secondary | ICD-10-CM | POA: Diagnosis not present

## 2017-11-05 DIAGNOSIS — R351 Nocturia: Secondary | ICD-10-CM | POA: Diagnosis not present

## 2017-11-05 DIAGNOSIS — R3912 Poor urinary stream: Secondary | ICD-10-CM | POA: Diagnosis not present

## 2017-11-08 DIAGNOSIS — I10 Essential (primary) hypertension: Secondary | ICD-10-CM | POA: Diagnosis not present

## 2017-11-08 DIAGNOSIS — I872 Venous insufficiency (chronic) (peripheral): Secondary | ICD-10-CM | POA: Diagnosis not present

## 2017-11-08 DIAGNOSIS — N400 Enlarged prostate without lower urinary tract symptoms: Secondary | ICD-10-CM | POA: Diagnosis not present

## 2017-11-08 DIAGNOSIS — Z9181 History of falling: Secondary | ICD-10-CM | POA: Diagnosis not present

## 2017-11-08 DIAGNOSIS — M1990 Unspecified osteoarthritis, unspecified site: Secondary | ICD-10-CM | POA: Diagnosis not present

## 2017-11-08 DIAGNOSIS — G20 Parkinson's disease: Secondary | ICD-10-CM | POA: Diagnosis not present

## 2017-11-10 DIAGNOSIS — G20 Parkinson's disease: Secondary | ICD-10-CM | POA: Diagnosis not present

## 2017-11-10 DIAGNOSIS — I10 Essential (primary) hypertension: Secondary | ICD-10-CM | POA: Diagnosis not present

## 2017-11-10 DIAGNOSIS — Z9181 History of falling: Secondary | ICD-10-CM | POA: Diagnosis not present

## 2017-11-10 DIAGNOSIS — I872 Venous insufficiency (chronic) (peripheral): Secondary | ICD-10-CM | POA: Diagnosis not present

## 2017-11-10 DIAGNOSIS — N400 Enlarged prostate without lower urinary tract symptoms: Secondary | ICD-10-CM | POA: Diagnosis not present

## 2017-11-10 DIAGNOSIS — M1990 Unspecified osteoarthritis, unspecified site: Secondary | ICD-10-CM | POA: Diagnosis not present

## 2017-11-15 DIAGNOSIS — I10 Essential (primary) hypertension: Secondary | ICD-10-CM | POA: Diagnosis not present

## 2017-11-15 DIAGNOSIS — N400 Enlarged prostate without lower urinary tract symptoms: Secondary | ICD-10-CM | POA: Diagnosis not present

## 2017-11-15 DIAGNOSIS — M1990 Unspecified osteoarthritis, unspecified site: Secondary | ICD-10-CM | POA: Diagnosis not present

## 2017-11-15 DIAGNOSIS — I872 Venous insufficiency (chronic) (peripheral): Secondary | ICD-10-CM | POA: Diagnosis not present

## 2017-11-15 DIAGNOSIS — G20 Parkinson's disease: Secondary | ICD-10-CM | POA: Diagnosis not present

## 2017-11-15 DIAGNOSIS — Z9181 History of falling: Secondary | ICD-10-CM | POA: Diagnosis not present

## 2017-11-17 DIAGNOSIS — I10 Essential (primary) hypertension: Secondary | ICD-10-CM | POA: Diagnosis not present

## 2017-11-17 DIAGNOSIS — Z9181 History of falling: Secondary | ICD-10-CM | POA: Diagnosis not present

## 2017-11-17 DIAGNOSIS — G20 Parkinson's disease: Secondary | ICD-10-CM | POA: Diagnosis not present

## 2017-11-17 DIAGNOSIS — N400 Enlarged prostate without lower urinary tract symptoms: Secondary | ICD-10-CM | POA: Diagnosis not present

## 2017-11-17 DIAGNOSIS — M1990 Unspecified osteoarthritis, unspecified site: Secondary | ICD-10-CM | POA: Diagnosis not present

## 2017-11-17 DIAGNOSIS — I872 Venous insufficiency (chronic) (peripheral): Secondary | ICD-10-CM | POA: Diagnosis not present

## 2017-11-22 DIAGNOSIS — G20 Parkinson's disease: Secondary | ICD-10-CM | POA: Diagnosis not present

## 2017-11-22 DIAGNOSIS — N400 Enlarged prostate without lower urinary tract symptoms: Secondary | ICD-10-CM | POA: Diagnosis not present

## 2017-11-22 DIAGNOSIS — Z9181 History of falling: Secondary | ICD-10-CM | POA: Diagnosis not present

## 2017-11-22 DIAGNOSIS — I10 Essential (primary) hypertension: Secondary | ICD-10-CM | POA: Diagnosis not present

## 2017-11-22 DIAGNOSIS — I872 Venous insufficiency (chronic) (peripheral): Secondary | ICD-10-CM | POA: Diagnosis not present

## 2017-11-22 DIAGNOSIS — M1990 Unspecified osteoarthritis, unspecified site: Secondary | ICD-10-CM | POA: Diagnosis not present

## 2017-11-24 DIAGNOSIS — I872 Venous insufficiency (chronic) (peripheral): Secondary | ICD-10-CM | POA: Diagnosis not present

## 2017-11-24 DIAGNOSIS — M1990 Unspecified osteoarthritis, unspecified site: Secondary | ICD-10-CM | POA: Diagnosis not present

## 2017-11-24 DIAGNOSIS — G20 Parkinson's disease: Secondary | ICD-10-CM | POA: Diagnosis not present

## 2017-11-24 DIAGNOSIS — N400 Enlarged prostate without lower urinary tract symptoms: Secondary | ICD-10-CM | POA: Diagnosis not present

## 2017-11-24 DIAGNOSIS — I10 Essential (primary) hypertension: Secondary | ICD-10-CM | POA: Diagnosis not present

## 2017-11-24 DIAGNOSIS — Z9181 History of falling: Secondary | ICD-10-CM | POA: Diagnosis not present

## 2017-12-01 DIAGNOSIS — M1990 Unspecified osteoarthritis, unspecified site: Secondary | ICD-10-CM | POA: Diagnosis not present

## 2017-12-01 DIAGNOSIS — G20 Parkinson's disease: Secondary | ICD-10-CM | POA: Diagnosis not present

## 2017-12-01 DIAGNOSIS — I872 Venous insufficiency (chronic) (peripheral): Secondary | ICD-10-CM | POA: Diagnosis not present

## 2017-12-01 DIAGNOSIS — N400 Enlarged prostate without lower urinary tract symptoms: Secondary | ICD-10-CM | POA: Diagnosis not present

## 2017-12-01 DIAGNOSIS — I10 Essential (primary) hypertension: Secondary | ICD-10-CM | POA: Diagnosis not present

## 2017-12-01 DIAGNOSIS — Z9181 History of falling: Secondary | ICD-10-CM | POA: Diagnosis not present

## 2017-12-03 DIAGNOSIS — N400 Enlarged prostate without lower urinary tract symptoms: Secondary | ICD-10-CM | POA: Diagnosis not present

## 2017-12-03 DIAGNOSIS — Z9181 History of falling: Secondary | ICD-10-CM | POA: Diagnosis not present

## 2017-12-03 DIAGNOSIS — I872 Venous insufficiency (chronic) (peripheral): Secondary | ICD-10-CM | POA: Diagnosis not present

## 2017-12-03 DIAGNOSIS — I10 Essential (primary) hypertension: Secondary | ICD-10-CM | POA: Diagnosis not present

## 2017-12-03 DIAGNOSIS — G20 Parkinson's disease: Secondary | ICD-10-CM | POA: Diagnosis not present

## 2017-12-03 DIAGNOSIS — M1990 Unspecified osteoarthritis, unspecified site: Secondary | ICD-10-CM | POA: Diagnosis not present

## 2017-12-07 DIAGNOSIS — G20 Parkinson's disease: Secondary | ICD-10-CM | POA: Diagnosis not present

## 2017-12-07 DIAGNOSIS — Z9181 History of falling: Secondary | ICD-10-CM | POA: Diagnosis not present

## 2017-12-07 DIAGNOSIS — I872 Venous insufficiency (chronic) (peripheral): Secondary | ICD-10-CM | POA: Diagnosis not present

## 2017-12-07 DIAGNOSIS — I10 Essential (primary) hypertension: Secondary | ICD-10-CM | POA: Diagnosis not present

## 2017-12-07 DIAGNOSIS — M1990 Unspecified osteoarthritis, unspecified site: Secondary | ICD-10-CM | POA: Diagnosis not present

## 2017-12-07 DIAGNOSIS — N400 Enlarged prostate without lower urinary tract symptoms: Secondary | ICD-10-CM | POA: Diagnosis not present

## 2017-12-08 DIAGNOSIS — I872 Venous insufficiency (chronic) (peripheral): Secondary | ICD-10-CM | POA: Diagnosis not present

## 2017-12-08 DIAGNOSIS — M1990 Unspecified osteoarthritis, unspecified site: Secondary | ICD-10-CM | POA: Diagnosis not present

## 2017-12-08 DIAGNOSIS — N400 Enlarged prostate without lower urinary tract symptoms: Secondary | ICD-10-CM | POA: Diagnosis not present

## 2017-12-08 DIAGNOSIS — G20 Parkinson's disease: Secondary | ICD-10-CM | POA: Diagnosis not present

## 2017-12-08 DIAGNOSIS — I10 Essential (primary) hypertension: Secondary | ICD-10-CM | POA: Diagnosis not present

## 2017-12-08 DIAGNOSIS — Z9181 History of falling: Secondary | ICD-10-CM | POA: Diagnosis not present

## 2017-12-10 DIAGNOSIS — I4891 Unspecified atrial fibrillation: Secondary | ICD-10-CM | POA: Diagnosis not present

## 2017-12-10 DIAGNOSIS — F0280 Dementia in other diseases classified elsewhere without behavioral disturbance: Secondary | ICD-10-CM | POA: Diagnosis not present

## 2017-12-10 DIAGNOSIS — N3281 Overactive bladder: Secondary | ICD-10-CM | POA: Diagnosis not present

## 2017-12-10 DIAGNOSIS — N183 Chronic kidney disease, stage 3 (moderate): Secondary | ICD-10-CM | POA: Diagnosis not present

## 2017-12-10 DIAGNOSIS — I872 Venous insufficiency (chronic) (peripheral): Secondary | ICD-10-CM | POA: Diagnosis not present

## 2017-12-10 DIAGNOSIS — I129 Hypertensive chronic kidney disease with stage 1 through stage 4 chronic kidney disease, or unspecified chronic kidney disease: Secondary | ICD-10-CM | POA: Diagnosis not present

## 2017-12-10 DIAGNOSIS — Z8673 Personal history of transient ischemic attack (TIA), and cerebral infarction without residual deficits: Secondary | ICD-10-CM | POA: Diagnosis not present

## 2017-12-10 DIAGNOSIS — M1991 Primary osteoarthritis, unspecified site: Secondary | ICD-10-CM | POA: Diagnosis not present

## 2017-12-10 DIAGNOSIS — Z87891 Personal history of nicotine dependence: Secondary | ICD-10-CM | POA: Diagnosis not present

## 2017-12-10 DIAGNOSIS — Z7722 Contact with and (suspected) exposure to environmental tobacco smoke (acute) (chronic): Secondary | ICD-10-CM | POA: Diagnosis not present

## 2017-12-10 DIAGNOSIS — N400 Enlarged prostate without lower urinary tract symptoms: Secondary | ICD-10-CM | POA: Diagnosis not present

## 2017-12-10 DIAGNOSIS — G3183 Dementia with Lewy bodies: Secondary | ICD-10-CM | POA: Diagnosis not present

## 2017-12-13 DIAGNOSIS — F0280 Dementia in other diseases classified elsewhere without behavioral disturbance: Secondary | ICD-10-CM | POA: Diagnosis not present

## 2017-12-13 DIAGNOSIS — I129 Hypertensive chronic kidney disease with stage 1 through stage 4 chronic kidney disease, or unspecified chronic kidney disease: Secondary | ICD-10-CM | POA: Diagnosis not present

## 2017-12-13 DIAGNOSIS — N183 Chronic kidney disease, stage 3 (moderate): Secondary | ICD-10-CM | POA: Diagnosis not present

## 2017-12-13 DIAGNOSIS — I4891 Unspecified atrial fibrillation: Secondary | ICD-10-CM | POA: Diagnosis not present

## 2017-12-13 DIAGNOSIS — G3183 Dementia with Lewy bodies: Secondary | ICD-10-CM | POA: Diagnosis not present

## 2017-12-13 DIAGNOSIS — Z8673 Personal history of transient ischemic attack (TIA), and cerebral infarction without residual deficits: Secondary | ICD-10-CM | POA: Diagnosis not present

## 2017-12-17 DIAGNOSIS — N183 Chronic kidney disease, stage 3 (moderate): Secondary | ICD-10-CM | POA: Diagnosis not present

## 2017-12-17 DIAGNOSIS — I4891 Unspecified atrial fibrillation: Secondary | ICD-10-CM | POA: Diagnosis not present

## 2017-12-17 DIAGNOSIS — Z8673 Personal history of transient ischemic attack (TIA), and cerebral infarction without residual deficits: Secondary | ICD-10-CM | POA: Diagnosis not present

## 2017-12-17 DIAGNOSIS — F0280 Dementia in other diseases classified elsewhere without behavioral disturbance: Secondary | ICD-10-CM | POA: Diagnosis not present

## 2017-12-17 DIAGNOSIS — I129 Hypertensive chronic kidney disease with stage 1 through stage 4 chronic kidney disease, or unspecified chronic kidney disease: Secondary | ICD-10-CM | POA: Diagnosis not present

## 2017-12-17 DIAGNOSIS — G3183 Dementia with Lewy bodies: Secondary | ICD-10-CM | POA: Diagnosis not present

## 2017-12-22 DIAGNOSIS — G3183 Dementia with Lewy bodies: Secondary | ICD-10-CM | POA: Diagnosis not present

## 2017-12-22 DIAGNOSIS — F0280 Dementia in other diseases classified elsewhere without behavioral disturbance: Secondary | ICD-10-CM | POA: Diagnosis not present

## 2017-12-22 DIAGNOSIS — I4891 Unspecified atrial fibrillation: Secondary | ICD-10-CM | POA: Diagnosis not present

## 2017-12-22 DIAGNOSIS — Z8673 Personal history of transient ischemic attack (TIA), and cerebral infarction without residual deficits: Secondary | ICD-10-CM | POA: Diagnosis not present

## 2017-12-22 DIAGNOSIS — I129 Hypertensive chronic kidney disease with stage 1 through stage 4 chronic kidney disease, or unspecified chronic kidney disease: Secondary | ICD-10-CM | POA: Diagnosis not present

## 2017-12-22 DIAGNOSIS — N183 Chronic kidney disease, stage 3 (moderate): Secondary | ICD-10-CM | POA: Diagnosis not present

## 2017-12-24 DIAGNOSIS — I890 Lymphedema, not elsewhere classified: Secondary | ICD-10-CM | POA: Diagnosis not present

## 2017-12-24 DIAGNOSIS — L97522 Non-pressure chronic ulcer of other part of left foot with fat layer exposed: Secondary | ICD-10-CM | POA: Diagnosis not present

## 2017-12-27 DIAGNOSIS — I4891 Unspecified atrial fibrillation: Secondary | ICD-10-CM | POA: Diagnosis not present

## 2017-12-27 DIAGNOSIS — N183 Chronic kidney disease, stage 3 (moderate): Secondary | ICD-10-CM | POA: Diagnosis not present

## 2017-12-27 DIAGNOSIS — G3183 Dementia with Lewy bodies: Secondary | ICD-10-CM | POA: Diagnosis not present

## 2017-12-27 DIAGNOSIS — F0280 Dementia in other diseases classified elsewhere without behavioral disturbance: Secondary | ICD-10-CM | POA: Diagnosis not present

## 2017-12-27 DIAGNOSIS — Z8673 Personal history of transient ischemic attack (TIA), and cerebral infarction without residual deficits: Secondary | ICD-10-CM | POA: Diagnosis not present

## 2017-12-27 DIAGNOSIS — I129 Hypertensive chronic kidney disease with stage 1 through stage 4 chronic kidney disease, or unspecified chronic kidney disease: Secondary | ICD-10-CM | POA: Diagnosis not present

## 2017-12-29 DIAGNOSIS — N183 Chronic kidney disease, stage 3 (moderate): Secondary | ICD-10-CM | POA: Diagnosis not present

## 2017-12-29 DIAGNOSIS — F0280 Dementia in other diseases classified elsewhere without behavioral disturbance: Secondary | ICD-10-CM | POA: Diagnosis not present

## 2017-12-29 DIAGNOSIS — I129 Hypertensive chronic kidney disease with stage 1 through stage 4 chronic kidney disease, or unspecified chronic kidney disease: Secondary | ICD-10-CM | POA: Diagnosis not present

## 2017-12-29 DIAGNOSIS — I4891 Unspecified atrial fibrillation: Secondary | ICD-10-CM | POA: Diagnosis not present

## 2017-12-29 DIAGNOSIS — Z8673 Personal history of transient ischemic attack (TIA), and cerebral infarction without residual deficits: Secondary | ICD-10-CM | POA: Diagnosis not present

## 2017-12-29 DIAGNOSIS — G3183 Dementia with Lewy bodies: Secondary | ICD-10-CM | POA: Diagnosis not present

## 2018-01-05 DIAGNOSIS — N183 Chronic kidney disease, stage 3 (moderate): Secondary | ICD-10-CM | POA: Diagnosis not present

## 2018-01-05 DIAGNOSIS — F0280 Dementia in other diseases classified elsewhere without behavioral disturbance: Secondary | ICD-10-CM | POA: Diagnosis not present

## 2018-01-05 DIAGNOSIS — G3183 Dementia with Lewy bodies: Secondary | ICD-10-CM | POA: Diagnosis not present

## 2018-01-05 DIAGNOSIS — I129 Hypertensive chronic kidney disease with stage 1 through stage 4 chronic kidney disease, or unspecified chronic kidney disease: Secondary | ICD-10-CM | POA: Diagnosis not present

## 2018-01-05 DIAGNOSIS — I4891 Unspecified atrial fibrillation: Secondary | ICD-10-CM | POA: Diagnosis not present

## 2018-01-05 DIAGNOSIS — Z8673 Personal history of transient ischemic attack (TIA), and cerebral infarction without residual deficits: Secondary | ICD-10-CM | POA: Diagnosis not present

## 2018-01-07 DIAGNOSIS — Z8673 Personal history of transient ischemic attack (TIA), and cerebral infarction without residual deficits: Secondary | ICD-10-CM | POA: Diagnosis not present

## 2018-01-07 DIAGNOSIS — I129 Hypertensive chronic kidney disease with stage 1 through stage 4 chronic kidney disease, or unspecified chronic kidney disease: Secondary | ICD-10-CM | POA: Diagnosis not present

## 2018-01-07 DIAGNOSIS — G3183 Dementia with Lewy bodies: Secondary | ICD-10-CM | POA: Diagnosis not present

## 2018-01-07 DIAGNOSIS — I4891 Unspecified atrial fibrillation: Secondary | ICD-10-CM | POA: Diagnosis not present

## 2018-01-07 DIAGNOSIS — N183 Chronic kidney disease, stage 3 (moderate): Secondary | ICD-10-CM | POA: Diagnosis not present

## 2018-01-07 DIAGNOSIS — F0280 Dementia in other diseases classified elsewhere without behavioral disturbance: Secondary | ICD-10-CM | POA: Diagnosis not present

## 2018-01-10 DIAGNOSIS — G3183 Dementia with Lewy bodies: Secondary | ICD-10-CM | POA: Diagnosis not present

## 2018-01-10 DIAGNOSIS — I129 Hypertensive chronic kidney disease with stage 1 through stage 4 chronic kidney disease, or unspecified chronic kidney disease: Secondary | ICD-10-CM | POA: Diagnosis not present

## 2018-01-10 DIAGNOSIS — F0280 Dementia in other diseases classified elsewhere without behavioral disturbance: Secondary | ICD-10-CM | POA: Diagnosis not present

## 2018-01-10 DIAGNOSIS — I4891 Unspecified atrial fibrillation: Secondary | ICD-10-CM | POA: Diagnosis not present

## 2018-01-10 DIAGNOSIS — N183 Chronic kidney disease, stage 3 (moderate): Secondary | ICD-10-CM | POA: Diagnosis not present

## 2018-01-10 DIAGNOSIS — Z8673 Personal history of transient ischemic attack (TIA), and cerebral infarction without residual deficits: Secondary | ICD-10-CM | POA: Diagnosis not present

## 2018-01-12 DIAGNOSIS — B353 Tinea pedis: Secondary | ICD-10-CM | POA: Diagnosis not present

## 2018-01-17 DIAGNOSIS — N183 Chronic kidney disease, stage 3 (moderate): Secondary | ICD-10-CM | POA: Diagnosis not present

## 2018-01-17 DIAGNOSIS — Z8673 Personal history of transient ischemic attack (TIA), and cerebral infarction without residual deficits: Secondary | ICD-10-CM | POA: Diagnosis not present

## 2018-01-17 DIAGNOSIS — I129 Hypertensive chronic kidney disease with stage 1 through stage 4 chronic kidney disease, or unspecified chronic kidney disease: Secondary | ICD-10-CM | POA: Diagnosis not present

## 2018-01-17 DIAGNOSIS — G3183 Dementia with Lewy bodies: Secondary | ICD-10-CM | POA: Diagnosis not present

## 2018-01-17 DIAGNOSIS — I4891 Unspecified atrial fibrillation: Secondary | ICD-10-CM | POA: Diagnosis not present

## 2018-01-17 DIAGNOSIS — F0280 Dementia in other diseases classified elsewhere without behavioral disturbance: Secondary | ICD-10-CM | POA: Diagnosis not present

## 2018-01-19 DIAGNOSIS — N183 Chronic kidney disease, stage 3 (moderate): Secondary | ICD-10-CM | POA: Diagnosis not present

## 2018-01-19 DIAGNOSIS — F0280 Dementia in other diseases classified elsewhere without behavioral disturbance: Secondary | ICD-10-CM | POA: Diagnosis not present

## 2018-01-19 DIAGNOSIS — I4891 Unspecified atrial fibrillation: Secondary | ICD-10-CM | POA: Diagnosis not present

## 2018-01-19 DIAGNOSIS — I129 Hypertensive chronic kidney disease with stage 1 through stage 4 chronic kidney disease, or unspecified chronic kidney disease: Secondary | ICD-10-CM | POA: Diagnosis not present

## 2018-01-19 DIAGNOSIS — Z8673 Personal history of transient ischemic attack (TIA), and cerebral infarction without residual deficits: Secondary | ICD-10-CM | POA: Diagnosis not present

## 2018-01-19 DIAGNOSIS — G3183 Dementia with Lewy bodies: Secondary | ICD-10-CM | POA: Diagnosis not present

## 2018-01-24 ENCOUNTER — Ambulatory Visit (INDEPENDENT_AMBULATORY_CARE_PROVIDER_SITE_OTHER): Payer: Medicare Other | Admitting: Neurology

## 2018-01-24 ENCOUNTER — Encounter: Payer: Self-pay | Admitting: Neurology

## 2018-01-24 VITALS — BP 144/66 | HR 53 | Wt 206.0 lb

## 2018-01-24 DIAGNOSIS — I6300 Cerebral infarction due to thrombosis of unspecified precerebral artery: Secondary | ICD-10-CM

## 2018-01-24 MED ORDER — ATORVASTATIN CALCIUM 20 MG PO TABS: 20.0000 mg | ORAL_TABLET | Freq: Every day | ORAL | 3 refills | Status: DC

## 2018-01-24 NOTE — Patient Instructions (Signed)
I had a long d/w patient and his daughter and caregiverabout his recent stroke, atrial fibrillationrisk for recurrent stroke/TIAs, personally independently reviewed imaging studies and stroke evaluation results and answered questions.Continue aspirin 325 mg daily  for secondary stroke prevention and maintain strict control of hypertension with blood pressure goal below 130/90, diabetes with hemoglobin A1c goal below 6.5% and lipids with LDL cholesterol goal below 70 mg/dL. The patient is high risk for fall and bleeding given his advanced age and hence we will not do anticoagulation as per family wishes. I also advised the patient to eat a healthy diet with plenty of whole grains, cereals, fruits and vegetables, exercise regularly and maintain ideal body weight .we also discussed fall prevention and cautions Followup in the future with my nurse practitioner Janett Billow in 6 months or call earlier if necessary  Fall Prevention in the Home Falls can cause injuries. They can happen to people of all ages. There are many things you can do to make your home safe and to help prevent falls. What can I do on the outside of my home?  Regularly fix the edges of walkways and driveways and fix any cracks.  Remove anything that might make you trip as you walk through a door, such as a raised step or threshold.  Trim any bushes or trees on the path to your home.  Use bright outdoor lighting.  Clear any walking paths of anything that might make someone trip, such as rocks or tools.  Regularly check to see if handrails are loose or broken. Make sure that both sides of any steps have handrails.  Any raised decks and porches should have guardrails on the edges.  Have any leaves, snow, or ice cleared regularly.  Use sand or salt on walking paths during winter.  Clean up any spills in your garage right away. This includes oil or grease spills. What can I do in the bathroom?  Use night lights.  Install grab bars  by the toilet and in the tub and shower. Do not use towel bars as grab bars.  Use non-skid mats or decals in the tub or shower.  If you need to sit down in the shower, use a plastic, non-slip stool.  Keep the floor dry. Clean up any water that spills on the floor as soon as it happens.  Remove soap buildup in the tub or shower regularly.  Attach bath mats securely with double-sided non-slip rug tape.  Do not have throw rugs and other things on the floor that can make you trip. What can I do in the bedroom?  Use night lights.  Make sure that you have a light by your bed that is easy to reach.  Do not use any sheets or blankets that are too big for your bed. They should not hang down onto the floor.  Have a firm chair that has side arms. You can use this for support while you get dressed.  Do not have throw rugs and other things on the floor that can make you trip. What can I do in the kitchen?  Clean up any spills right away.  Avoid walking on wet floors.  Keep items that you use a lot in easy-to-reach places.  If you need to reach something above you, use a strong step stool that has a grab bar.  Keep electrical cords out of the way.  Do not use floor polish or wax that makes floors slippery. If you must use wax, use  non-skid floor wax.  Do not have throw rugs and other things on the floor that can make you trip. What can I do with my stairs?  Do not leave any items on the stairs.  Make sure that there are handrails on both sides of the stairs and use them. Fix handrails that are broken or loose. Make sure that handrails are as long as the stairways.  Check any carpeting to make sure that it is firmly attached to the stairs. Fix any carpet that is loose or worn.  Avoid having throw rugs at the top or bottom of the stairs. If you do have throw rugs, attach them to the floor with carpet tape.  Make sure that you have a light switch at the top of the stairs and the  bottom of the stairs. If you do not have them, ask someone to add them for you. What else can I do to help prevent falls?  Wear shoes that: ? Do not have high heels. ? Have rubber bottoms. ? Are comfortable and fit you well. ? Are closed at the toe. Do not wear sandals.  If you use a stepladder: ? Make sure that it is fully opened. Do not climb a closed stepladder. ? Make sure that both sides of the stepladder are locked into place. ? Ask someone to hold it for you, if possible.  Clearly mark and make sure that you can see: ? Any grab bars or handrails. ? First and last steps. ? Where the edge of each step is.  Use tools that help you move around (mobility aids) if they are needed. These include: ? Canes. ? Walkers. ? Scooters. ? Crutches.  Turn on the lights when you go into a dark area. Replace any light bulbs as soon as they burn out.  Set up your furniture so you have a clear path. Avoid moving your furniture around.  If any of your floors are uneven, fix them.  If there are any pets around you, be aware of where they are.  Review your medicines with your doctor. Some medicines can make you feel dizzy. This can increase your chance of falling. Ask your doctor what other things that you can do to help prevent falls. This information is not intended to replace advice given to you by your health care provider. Make sure you discuss any questions you have with your health care provider. Document Released: 10/10/2009 Document Revised: 05/21/2016 Document Reviewed: 01/18/2015 Elsevier Interactive Patient Education  [employer].

## 2018-01-24 NOTE — Progress Notes (Signed)
[facility] [address] [place]. [place] [address] [phone]       OFFICE FOLLOW UP VISIT NOTE  [name] Date of Birth:  Oct 07, 1918 Medical Record Number:  [id number]    Reason for Referral:  Hospital TIA follow up  HPI: Nicholas Andrews is a 84 year old male who is being seen today for his first hospital follow up visit after TIA on 10/26/17 likely secondary to atrial fibrillation. History is obtained   from patient, daughter and caregiver. I have personally reviewed imaging films from hospital.ization  He has PMH atrial fibrillation, BPH, Lewy body dementia, and Parkinsons. Patient presented after awaking from a nap and was observed by caregiver to have unintelligible speech, left sided facial droop and weakness in left arm and leg. Symptoms lasted approx 2 hours. Pt had CT scan which was negative for acute abnormalities. Pt refused MRI. B/L carotid duplex showed right ICA 1-39% stenosis and left ICA 1-39% stenosis. Was previously on aspirin 81mg  but increased to aspirin 325mg . Not on anticoagulation due to high risk of falling with parkinson's and dementia. LDL at 107 and was started on Lipitor 40mg . the patient did not undergo MRI scan and aggressive workup given his poor medical condition and family's wishes not to put him through a lot of discomfort At todays visit, pt is accompanied by caregiver and daughter. Pt living at home with 24 hour aid service. Pt using rolling walker to assist with ambulation. Per caregiver, pt unsteady on feet and needs assistance with walker and  1 person. Has had some falls recently. Denies hitting head or hurting self. Per daughter and caregiver, memory loss increased, and increase in fatigue. Lower extremity +3 pitting edema - daughter notified PCP. Daughter unable to get Lipitor filled after hospital d/c and was told that he does not need to continue on this medication by a provider - educated importance on patient taking this medication.  Pt denies side effects from Lipitor while taking it. Pt continues to take aspirin. Complains on bruising but per daughter and caregiver, this is not new. No increase in bleeding or c/o stomach pain. No new symptoms for TIA/stroke symptoms.   ROS:   14 system review of systems is positive for weight loss, fatigue, easy bruising, easy bleeding, swelling in legs/feet, snoring, constipation, urination problems, frequent UTIs, memory loss, confusion, slurred speech  PMH:  Past Medical History:  Diagnosis Date  . Acute blood loss anemia   . BPH (benign prostatic hyperplasia)   . Cataract   . CKD (chronic kidney disease), stage III ([place])   . Closed displaced fracture of right femoral neck with routine healing   . H/O tongue cancer   . Lewy body dementia   . Parkinson's syndrome ([place])   . Protein calorie malnutrition ([place])   . Unsteady gait     Social History:  Social History   Socioeconomic History  . Marital status: Married    Spouse name: Not on file  . Number of children: Not on file  . Years of education: Not on file  . Highest education level: Not on file  Social Needs  . Financial resource strain: Not on file  . Food insecurity - worry: Not on file  . Food insecurity - inability: Not on file  . Transportation needs - medical: Not on file  . Transportation needs - non-medical: Not on file  Occupational History  . Not on file  Tobacco Use  . Smoking status: Former Research scientist (life sciences)  .  Smokeless tobacco: Never Used  Substance and Sexual Activity  . Alcohol use: No  . Drug use: No  . Sexual activity: No    Birth control/protection: Abstinence  Other Topics Concern  . Not on file  Social History Narrative  . Not on file    Medications:   Current Outpatient Medications on File Prior to Visit  Medication Sig Dispense Refill  . aspirin EC 325 MG tablet Take 1 tablet (325 mg total) by mouth daily. 30 tablet 0  . finasteride (PROSCAR) 5 MG tablet Take 5 mg by mouth daily.  5  .  ketoconazole (NIZORAL) 2 % cream Apply 1 application topically daily.    . Mirabegron (MYRBETRIQ PO) Take by mouth.    . Multiple Vitamin (MULTIVITAMIN WITH MINERALS) TABS tablet Take 1 tablet by mouth daily.    Marland Kitchen rOPINIRole (REQUIP) 1 MG tablet Take 1 mg by mouth at bedtime.  0  . triamcinolone cream (KENALOG) 0.1 % Apply 1 application topically daily as needed.  0  . atorvastatin (LIPITOR) 40 MG tablet Take 1 tablet (40 mg total) by mouth daily at 6 PM. (Patient not taking: Reported on 01/24/2018) 30 tablet 0   No current facility-administered medications on file prior to visit.     Allergies:  No Known Allergies  Physical Exam General: Frail cachectic elderly Caucasian male seated, in no evident distress Head: head normocephalic and atraumatic.   Neck: supple with no carotid or supraclavicular bruits Cardiovascular: irregular rate and rhythm; +3 pitting edema bilateral lower extremities Musculoskeletal: no deformity Skin:  Petechiae on bilateral upper extremities  Vascular:  Normal pulses all extremities  Neurologic Exam Mental Status: Awake and fully alert. Oriented to place and time. Increase in memory loss. Attention span, concentration and fund of knowledge appropriate. Mood and affect appropriate. Diminished facial expression. Positive glabellar tap. No drooling. Cranial Nerves: Fundoscopic exam reveals sharp disc margins. Pupils equal, briskly reactive to light. Extraocular movements full without nystagmus. Visual fields full to confrontation. Hearing intact. Facial sensation intact. Face, tongue, palate moves normally and symmetrically.  Motor: Normal bulk and tone. Left grip strength decreased 4/5. Left leg weakness at hip flexor 4/5. Left dosiflexon weakness 4/5. Right sided strength 5/5. Right action tremor right upper extremity greater than left. Mild cogwheel rigidity at the right wrist upon activation on the  Sensory.: intact to touch , pinprick , position and vibratory  sensation.  Coordination: Rapid alternating movements normal in all extremities. Finger-to-nose and heel-to-shin performed accurately bilaterally. Gait and Station: Shuffling gait present. Tandem walk not tested. Reflexes: 1+ and symmetric. Toes downgoing.    HAS-BLED score 4 points Risk was 8.9% in one validation study and 8.70 bleeds per 100 patient-years in another validation study. Alternatives to anticoagulation would be appropriate   CHA2DS2-VASc Score for A fib stroke risk 4 points Stroke risk was 4.8% per year in >90,000 patients (the [place] Atrial Fibrillation Cohort Study) and 6.7% risk of stroke/TIA/systemic embolism.    NIHSS  1 Modified Rankin 4   ASSESSMENT: Nicholas Andrews is a 84 year old male with PMH atrial fibrillation, BPH, Lewy body dementia, and Parkinson's. Pt presented to hospital on 10/26/2017 with TIA likely secondary to atrial fibrillation. Unable to have patient on anticoagulation due to increased fall risk with Parkinson's. Was previously on aspirin 81mg  and increased to aspirin 325mg . LDL increased at 107 and placed on Lipitor. Daughter was unable to refill this prescription and was told to not continue per a provider the daughter could not  remember.    PLAN: - I had a long discussion with the patient and daughter regarding his recent TIA, atrial fibrillation discussed the risk-benefit of anticoagulation and answered questions. Recommended starting patient back on Lipitor for secondary stroke prevention. Patient did not experience side effects while previously on medication. BP remains under control at this time. aspirin 325mg . we also discussed fall risk and safety precautions. He was advised to use a walker at all times and walk with assistance only. Greater than 50% time during this 30 minute office visit was spent on counseling and coordination of care about his TIA, atrial fibrillation, stroke risk and fall risk discussion and answering questions he will  return for follow-up in 6 months with Janett Billow, my  nurse practitioner or call earlier if necessary Antony Contras, MD  [facility] [address] [place], [place] [address]  Phone [phone] Fax [phone] Note: This document was prepared with digital dictation and possible smart phrase technology. Any transcriptional errors that result from this process are unintentional.

## 2018-01-25 DIAGNOSIS — I129 Hypertensive chronic kidney disease with stage 1 through stage 4 chronic kidney disease, or unspecified chronic kidney disease: Secondary | ICD-10-CM | POA: Diagnosis not present

## 2018-01-25 DIAGNOSIS — Z8673 Personal history of transient ischemic attack (TIA), and cerebral infarction without residual deficits: Secondary | ICD-10-CM | POA: Diagnosis not present

## 2018-01-25 DIAGNOSIS — N183 Chronic kidney disease, stage 3 (moderate): Secondary | ICD-10-CM | POA: Diagnosis not present

## 2018-01-25 DIAGNOSIS — I4891 Unspecified atrial fibrillation: Secondary | ICD-10-CM | POA: Diagnosis not present

## 2018-01-25 DIAGNOSIS — F0280 Dementia in other diseases classified elsewhere without behavioral disturbance: Secondary | ICD-10-CM | POA: Diagnosis not present

## 2018-01-25 DIAGNOSIS — G3183 Dementia with Lewy bodies: Secondary | ICD-10-CM | POA: Diagnosis not present

## 2018-01-26 DIAGNOSIS — G3183 Dementia with Lewy bodies: Secondary | ICD-10-CM | POA: Diagnosis not present

## 2018-01-26 DIAGNOSIS — F0280 Dementia in other diseases classified elsewhere without behavioral disturbance: Secondary | ICD-10-CM | POA: Diagnosis not present

## 2018-01-26 DIAGNOSIS — I129 Hypertensive chronic kidney disease with stage 1 through stage 4 chronic kidney disease, or unspecified chronic kidney disease: Secondary | ICD-10-CM | POA: Diagnosis not present

## 2018-01-26 DIAGNOSIS — Z8673 Personal history of transient ischemic attack (TIA), and cerebral infarction without residual deficits: Secondary | ICD-10-CM | POA: Diagnosis not present

## 2018-01-26 DIAGNOSIS — N183 Chronic kidney disease, stage 3 (moderate): Secondary | ICD-10-CM | POA: Diagnosis not present

## 2018-01-26 DIAGNOSIS — I4891 Unspecified atrial fibrillation: Secondary | ICD-10-CM | POA: Diagnosis not present

## 2018-01-31 DIAGNOSIS — G3183 Dementia with Lewy bodies: Secondary | ICD-10-CM | POA: Diagnosis not present

## 2018-01-31 DIAGNOSIS — N183 Chronic kidney disease, stage 3 (moderate): Secondary | ICD-10-CM | POA: Diagnosis not present

## 2018-01-31 DIAGNOSIS — I129 Hypertensive chronic kidney disease with stage 1 through stage 4 chronic kidney disease, or unspecified chronic kidney disease: Secondary | ICD-10-CM | POA: Diagnosis not present

## 2018-01-31 DIAGNOSIS — F0280 Dementia in other diseases classified elsewhere without behavioral disturbance: Secondary | ICD-10-CM | POA: Diagnosis not present

## 2018-01-31 DIAGNOSIS — Z8673 Personal history of transient ischemic attack (TIA), and cerebral infarction without residual deficits: Secondary | ICD-10-CM | POA: Diagnosis not present

## 2018-01-31 DIAGNOSIS — I4891 Unspecified atrial fibrillation: Secondary | ICD-10-CM | POA: Diagnosis not present

## 2018-02-07 DIAGNOSIS — Z8673 Personal history of transient ischemic attack (TIA), and cerebral infarction without residual deficits: Secondary | ICD-10-CM | POA: Diagnosis not present

## 2018-02-07 DIAGNOSIS — F0280 Dementia in other diseases classified elsewhere without behavioral disturbance: Secondary | ICD-10-CM | POA: Diagnosis not present

## 2018-02-07 DIAGNOSIS — G3183 Dementia with Lewy bodies: Secondary | ICD-10-CM | POA: Diagnosis not present

## 2018-02-07 DIAGNOSIS — I4891 Unspecified atrial fibrillation: Secondary | ICD-10-CM | POA: Diagnosis not present

## 2018-02-07 DIAGNOSIS — I129 Hypertensive chronic kidney disease with stage 1 through stage 4 chronic kidney disease, or unspecified chronic kidney disease: Secondary | ICD-10-CM | POA: Diagnosis not present

## 2018-02-07 DIAGNOSIS — N183 Chronic kidney disease, stage 3 (moderate): Secondary | ICD-10-CM | POA: Diagnosis not present

## 2018-02-08 DIAGNOSIS — N400 Enlarged prostate without lower urinary tract symptoms: Secondary | ICD-10-CM | POA: Diagnosis not present

## 2018-02-08 DIAGNOSIS — N3281 Overactive bladder: Secondary | ICD-10-CM | POA: Diagnosis not present

## 2018-02-08 DIAGNOSIS — M1991 Primary osteoarthritis, unspecified site: Secondary | ICD-10-CM | POA: Diagnosis not present

## 2018-02-08 DIAGNOSIS — F0280 Dementia in other diseases classified elsewhere without behavioral disturbance: Secondary | ICD-10-CM | POA: Diagnosis not present

## 2018-02-08 DIAGNOSIS — I872 Venous insufficiency (chronic) (peripheral): Secondary | ICD-10-CM | POA: Diagnosis not present

## 2018-02-08 DIAGNOSIS — G3183 Dementia with Lewy bodies: Secondary | ICD-10-CM | POA: Diagnosis not present

## 2018-02-08 DIAGNOSIS — N183 Chronic kidney disease, stage 3 (moderate): Secondary | ICD-10-CM | POA: Diagnosis not present

## 2018-02-08 DIAGNOSIS — Z7722 Contact with and (suspected) exposure to environmental tobacco smoke (acute) (chronic): Secondary | ICD-10-CM | POA: Diagnosis not present

## 2018-02-08 DIAGNOSIS — Z8673 Personal history of transient ischemic attack (TIA), and cerebral infarction without residual deficits: Secondary | ICD-10-CM | POA: Diagnosis not present

## 2018-02-08 DIAGNOSIS — Z87891 Personal history of nicotine dependence: Secondary | ICD-10-CM | POA: Diagnosis not present

## 2018-02-08 DIAGNOSIS — I129 Hypertensive chronic kidney disease with stage 1 through stage 4 chronic kidney disease, or unspecified chronic kidney disease: Secondary | ICD-10-CM | POA: Diagnosis not present

## 2018-02-08 DIAGNOSIS — I4891 Unspecified atrial fibrillation: Secondary | ICD-10-CM | POA: Diagnosis not present

## 2018-02-11 DIAGNOSIS — Z8673 Personal history of transient ischemic attack (TIA), and cerebral infarction without residual deficits: Secondary | ICD-10-CM | POA: Diagnosis not present

## 2018-02-11 DIAGNOSIS — I129 Hypertensive chronic kidney disease with stage 1 through stage 4 chronic kidney disease, or unspecified chronic kidney disease: Secondary | ICD-10-CM | POA: Diagnosis not present

## 2018-02-11 DIAGNOSIS — F0280 Dementia in other diseases classified elsewhere without behavioral disturbance: Secondary | ICD-10-CM | POA: Diagnosis not present

## 2018-02-11 DIAGNOSIS — G3183 Dementia with Lewy bodies: Secondary | ICD-10-CM | POA: Diagnosis not present

## 2018-02-11 DIAGNOSIS — I4891 Unspecified atrial fibrillation: Secondary | ICD-10-CM | POA: Diagnosis not present

## 2018-02-11 DIAGNOSIS — N183 Chronic kidney disease, stage 3 (moderate): Secondary | ICD-10-CM | POA: Diagnosis not present

## 2018-02-16 DIAGNOSIS — F0280 Dementia in other diseases classified elsewhere without behavioral disturbance: Secondary | ICD-10-CM | POA: Diagnosis not present

## 2018-02-16 DIAGNOSIS — G3183 Dementia with Lewy bodies: Secondary | ICD-10-CM | POA: Diagnosis not present

## 2018-02-16 DIAGNOSIS — I4891 Unspecified atrial fibrillation: Secondary | ICD-10-CM | POA: Diagnosis not present

## 2018-02-16 DIAGNOSIS — I129 Hypertensive chronic kidney disease with stage 1 through stage 4 chronic kidney disease, or unspecified chronic kidney disease: Secondary | ICD-10-CM | POA: Diagnosis not present

## 2018-02-16 DIAGNOSIS — Z8673 Personal history of transient ischemic attack (TIA), and cerebral infarction without residual deficits: Secondary | ICD-10-CM | POA: Diagnosis not present

## 2018-02-16 DIAGNOSIS — N183 Chronic kidney disease, stage 3 (moderate): Secondary | ICD-10-CM | POA: Diagnosis not present

## 2018-02-22 DIAGNOSIS — G3183 Dementia with Lewy bodies: Secondary | ICD-10-CM | POA: Diagnosis not present

## 2018-02-22 DIAGNOSIS — N183 Chronic kidney disease, stage 3 (moderate): Secondary | ICD-10-CM | POA: Diagnosis not present

## 2018-02-22 DIAGNOSIS — Z8673 Personal history of transient ischemic attack (TIA), and cerebral infarction without residual deficits: Secondary | ICD-10-CM | POA: Diagnosis not present

## 2018-02-22 DIAGNOSIS — F0280 Dementia in other diseases classified elsewhere without behavioral disturbance: Secondary | ICD-10-CM | POA: Diagnosis not present

## 2018-02-22 DIAGNOSIS — I4891 Unspecified atrial fibrillation: Secondary | ICD-10-CM | POA: Diagnosis not present

## 2018-02-22 DIAGNOSIS — I129 Hypertensive chronic kidney disease with stage 1 through stage 4 chronic kidney disease, or unspecified chronic kidney disease: Secondary | ICD-10-CM | POA: Diagnosis not present

## 2018-02-24 DIAGNOSIS — N183 Chronic kidney disease, stage 3 (moderate): Secondary | ICD-10-CM | POA: Diagnosis not present

## 2018-02-24 DIAGNOSIS — Z8673 Personal history of transient ischemic attack (TIA), and cerebral infarction without residual deficits: Secondary | ICD-10-CM | POA: Diagnosis not present

## 2018-02-24 DIAGNOSIS — I4891 Unspecified atrial fibrillation: Secondary | ICD-10-CM | POA: Diagnosis not present

## 2018-02-24 DIAGNOSIS — I129 Hypertensive chronic kidney disease with stage 1 through stage 4 chronic kidney disease, or unspecified chronic kidney disease: Secondary | ICD-10-CM | POA: Diagnosis not present

## 2018-02-24 DIAGNOSIS — G3183 Dementia with Lewy bodies: Secondary | ICD-10-CM | POA: Diagnosis not present

## 2018-02-24 DIAGNOSIS — F0280 Dementia in other diseases classified elsewhere without behavioral disturbance: Secondary | ICD-10-CM | POA: Diagnosis not present

## 2018-03-02 DIAGNOSIS — N183 Chronic kidney disease, stage 3 (moderate): Secondary | ICD-10-CM | POA: Diagnosis not present

## 2018-03-02 DIAGNOSIS — I129 Hypertensive chronic kidney disease with stage 1 through stage 4 chronic kidney disease, or unspecified chronic kidney disease: Secondary | ICD-10-CM | POA: Diagnosis not present

## 2018-03-02 DIAGNOSIS — I4891 Unspecified atrial fibrillation: Secondary | ICD-10-CM | POA: Diagnosis not present

## 2018-03-02 DIAGNOSIS — F0280 Dementia in other diseases classified elsewhere without behavioral disturbance: Secondary | ICD-10-CM | POA: Diagnosis not present

## 2018-03-02 DIAGNOSIS — G3183 Dementia with Lewy bodies: Secondary | ICD-10-CM | POA: Diagnosis not present

## 2018-03-02 DIAGNOSIS — Z8673 Personal history of transient ischemic attack (TIA), and cerebral infarction without residual deficits: Secondary | ICD-10-CM | POA: Diagnosis not present

## 2018-03-08 DIAGNOSIS — N183 Chronic kidney disease, stage 3 (moderate): Secondary | ICD-10-CM | POA: Diagnosis not present

## 2018-03-08 DIAGNOSIS — G3183 Dementia with Lewy bodies: Secondary | ICD-10-CM | POA: Diagnosis not present

## 2018-03-08 DIAGNOSIS — F0280 Dementia in other diseases classified elsewhere without behavioral disturbance: Secondary | ICD-10-CM | POA: Diagnosis not present

## 2018-03-08 DIAGNOSIS — I4891 Unspecified atrial fibrillation: Secondary | ICD-10-CM | POA: Diagnosis not present

## 2018-03-08 DIAGNOSIS — Z8673 Personal history of transient ischemic attack (TIA), and cerebral infarction without residual deficits: Secondary | ICD-10-CM | POA: Diagnosis not present

## 2018-03-08 DIAGNOSIS — I129 Hypertensive chronic kidney disease with stage 1 through stage 4 chronic kidney disease, or unspecified chronic kidney disease: Secondary | ICD-10-CM | POA: Diagnosis not present

## 2018-03-14 DIAGNOSIS — G3183 Dementia with Lewy bodies: Secondary | ICD-10-CM | POA: Diagnosis not present

## 2018-03-14 DIAGNOSIS — Z8673 Personal history of transient ischemic attack (TIA), and cerebral infarction without residual deficits: Secondary | ICD-10-CM | POA: Diagnosis not present

## 2018-03-14 DIAGNOSIS — F0280 Dementia in other diseases classified elsewhere without behavioral disturbance: Secondary | ICD-10-CM | POA: Diagnosis not present

## 2018-03-14 DIAGNOSIS — N183 Chronic kidney disease, stage 3 (moderate): Secondary | ICD-10-CM | POA: Diagnosis not present

## 2018-03-14 DIAGNOSIS — I4891 Unspecified atrial fibrillation: Secondary | ICD-10-CM | POA: Diagnosis not present

## 2018-03-14 DIAGNOSIS — I129 Hypertensive chronic kidney disease with stage 1 through stage 4 chronic kidney disease, or unspecified chronic kidney disease: Secondary | ICD-10-CM | POA: Diagnosis not present

## 2018-03-16 DIAGNOSIS — G3183 Dementia with Lewy bodies: Secondary | ICD-10-CM | POA: Diagnosis not present

## 2018-03-16 DIAGNOSIS — Z8673 Personal history of transient ischemic attack (TIA), and cerebral infarction without residual deficits: Secondary | ICD-10-CM | POA: Diagnosis not present

## 2018-03-16 DIAGNOSIS — N183 Chronic kidney disease, stage 3 (moderate): Secondary | ICD-10-CM | POA: Diagnosis not present

## 2018-03-16 DIAGNOSIS — F0280 Dementia in other diseases classified elsewhere without behavioral disturbance: Secondary | ICD-10-CM | POA: Diagnosis not present

## 2018-03-16 DIAGNOSIS — I4891 Unspecified atrial fibrillation: Secondary | ICD-10-CM | POA: Diagnosis not present

## 2018-03-16 DIAGNOSIS — I129 Hypertensive chronic kidney disease with stage 1 through stage 4 chronic kidney disease, or unspecified chronic kidney disease: Secondary | ICD-10-CM | POA: Diagnosis not present

## 2018-03-23 DIAGNOSIS — I4891 Unspecified atrial fibrillation: Secondary | ICD-10-CM | POA: Diagnosis not present

## 2018-03-23 DIAGNOSIS — Z8673 Personal history of transient ischemic attack (TIA), and cerebral infarction without residual deficits: Secondary | ICD-10-CM | POA: Diagnosis not present

## 2018-03-23 DIAGNOSIS — I129 Hypertensive chronic kidney disease with stage 1 through stage 4 chronic kidney disease, or unspecified chronic kidney disease: Secondary | ICD-10-CM | POA: Diagnosis not present

## 2018-03-23 DIAGNOSIS — F0280 Dementia in other diseases classified elsewhere without behavioral disturbance: Secondary | ICD-10-CM | POA: Diagnosis not present

## 2018-03-23 DIAGNOSIS — N183 Chronic kidney disease, stage 3 (moderate): Secondary | ICD-10-CM | POA: Diagnosis not present

## 2018-03-23 DIAGNOSIS — G3183 Dementia with Lewy bodies: Secondary | ICD-10-CM | POA: Diagnosis not present

## 2018-03-24 DIAGNOSIS — N183 Chronic kidney disease, stage 3 (moderate): Secondary | ICD-10-CM | POA: Diagnosis not present

## 2018-03-24 DIAGNOSIS — Z8673 Personal history of transient ischemic attack (TIA), and cerebral infarction without residual deficits: Secondary | ICD-10-CM | POA: Diagnosis not present

## 2018-03-24 DIAGNOSIS — F0280 Dementia in other diseases classified elsewhere without behavioral disturbance: Secondary | ICD-10-CM | POA: Diagnosis not present

## 2018-03-24 DIAGNOSIS — G3183 Dementia with Lewy bodies: Secondary | ICD-10-CM | POA: Diagnosis not present

## 2018-03-24 DIAGNOSIS — I129 Hypertensive chronic kidney disease with stage 1 through stage 4 chronic kidney disease, or unspecified chronic kidney disease: Secondary | ICD-10-CM | POA: Diagnosis not present

## 2018-03-24 DIAGNOSIS — I4891 Unspecified atrial fibrillation: Secondary | ICD-10-CM | POA: Diagnosis not present

## 2018-03-29 DIAGNOSIS — R8279 Other abnormal findings on microbiological examination of urine: Secondary | ICD-10-CM | POA: Diagnosis not present

## 2018-03-29 DIAGNOSIS — K4000 Bilateral inguinal hernia, with obstruction, without gangrene, not specified as recurrent: Secondary | ICD-10-CM | POA: Diagnosis not present

## 2018-03-30 DIAGNOSIS — I872 Venous insufficiency (chronic) (peripheral): Secondary | ICD-10-CM | POA: Diagnosis not present

## 2018-03-30 DIAGNOSIS — R600 Localized edema: Secondary | ICD-10-CM | POA: Diagnosis not present

## 2018-03-30 DIAGNOSIS — Z6827 Body mass index (BMI) 27.0-27.9, adult: Secondary | ICD-10-CM | POA: Diagnosis not present

## 2018-04-07 DIAGNOSIS — Z8719 Personal history of other diseases of the digestive system: Secondary | ICD-10-CM | POA: Diagnosis not present

## 2018-04-14 DIAGNOSIS — I739 Peripheral vascular disease, unspecified: Secondary | ICD-10-CM | POA: Diagnosis not present

## 2018-04-14 DIAGNOSIS — L603 Nail dystrophy: Secondary | ICD-10-CM | POA: Diagnosis not present

## 2018-05-26 DIAGNOSIS — R627 Adult failure to thrive: Secondary | ICD-10-CM | POA: Diagnosis not present

## 2018-05-26 DIAGNOSIS — R634 Abnormal weight loss: Secondary | ICD-10-CM | POA: Diagnosis not present

## 2018-05-26 DIAGNOSIS — F0390 Unspecified dementia without behavioral disturbance: Secondary | ICD-10-CM | POA: Diagnosis not present

## 2018-05-26 DIAGNOSIS — R2689 Other abnormalities of gait and mobility: Secondary | ICD-10-CM | POA: Diagnosis not present

## 2018-05-26 DIAGNOSIS — R600 Localized edema: Secondary | ICD-10-CM | POA: Diagnosis not present

## 2018-05-26 DIAGNOSIS — M25459 Effusion, unspecified hip: Secondary | ICD-10-CM | POA: Diagnosis not present

## 2018-05-27 ENCOUNTER — Telehealth: Payer: Self-pay

## 2018-05-27 NOTE — Telephone Encounter (Signed)
Phone call placed to patient to offer to schedule a visit with Palliative Care. Visit scheduled for Monday 05/30/18 @ 2:30pm

## 2018-05-30 ENCOUNTER — Other Ambulatory Visit: Payer: Medicare Other | Admitting: Internal Medicine

## 2018-05-30 ENCOUNTER — Other Ambulatory Visit: Payer: Self-pay | Admitting: Neurology

## 2018-05-30 ENCOUNTER — Encounter: Payer: Self-pay | Admitting: Internal Medicine

## 2018-05-30 DIAGNOSIS — Z7409 Other reduced mobility: Secondary | ICD-10-CM

## 2018-05-30 DIAGNOSIS — Z515 Encounter for palliative care: Secondary | ICD-10-CM

## 2018-05-30 DIAGNOSIS — G20 Parkinson's disease: Secondary | ICD-10-CM

## 2018-05-30 DIAGNOSIS — F0280 Dementia in other diseases classified elsewhere without behavioral disturbance: Secondary | ICD-10-CM

## 2018-05-30 NOTE — Progress Notes (Signed)
05/30/2018  PALLIATIVE CARE CONSULT Nicholas Andrews. DOB: 08/20/1918. [place], [place] [address]. [phone]           REFERRING PROVIDER:  PCP Dr. Leanna Andrews; [place] [phone], ([phone]. [place], [place], Hank [place], Mark Yates ([facility]) Family: (dtr) Nicholas Andrews [facility]); [phone]. Has 24/7 paid care providers Nicholas Andrews, Nicholas Andrews, and others) paid care providers (Nicholas Andrews)  BILLABLE  ICD-10: Mobility impairment IMPRESSION/RECOMMENDATIONS: 1. Mobility impairment: Protruding, slightly displaced right hip tronchanter, site of previous ORIF Jan 2017. Patient has scheduled visit with Dr. Lorin Andrews end of this month; daughter asking if I could call their office to see patient can be worked in at an earlier date (Nicholas Andrews). Patient has had a few instances over these last few weeks of that hip giving out on him/loss of balance, such that he slips to the chair or ground. No injuries as paid caregivers are hypervigilant when assisting patient with ambulating. Patient notes mild discomfort when ambulating to that area.  2. Stage one/two Pressure Injury over right hip,and  between gluteal folds. No signs infection/inflammation. Recommended keeping areas open to air / continue barrier cream as they are doing.  3. Cognitive decline: FAST 6e. A & O to self, place. Recognizes / knows family members; forgets names of paid care givers. Pleasantly confused.  Needs assist with dressing/personal hygiene but able to feed himself. Incontinent of urine, sometimes stool. Able to stand independently and ambulate with guiding assist / walker. Good appetite but weight loss of 8 lbs over last few months to current we 192lb, Ht 6'2", BMI 26. 4. Advanced Care Planning: DNR form in home. Briefly discussed MOST form; left copy in home along with written educational material regarding DNR/MOST.   a. Daughter Nicholas Andrews plans to discuss MOST form with her siblings;  will call me to review/sign form thereafter. We discussed hospice eligibility criteria (further cognitive/functional decline/6 month prognosis) setting cognitive.  5. Follow up:  a. Daughter will call us on a prn basis if need for home symptom management, or to assess for future hospice eligibility if patient continues to show decline.  I spent 75 minutes providing this consultation (from 2:30pm to 3:45pm). More than 50% of that time was spent coordinating communication.  HPI: Nicholas Andrews is a 84yo male, with medical history significant for progressive cognitive decline, HTN,  and  Parkinson's disease. He is referred to Palliative Care for assistance with symptom management, help with coordination of resources, and ongoing discussions regarding goals of care/advanced directives  CODE STATUS: DNR PPS: 50%.  HOSPICE ELIGIBILITY: no; prognosis thought greater than 6 months.  MEDICATIONS :  Aspirin 325mg  qd, Finasteride 5mg  qd, Myrbetriq25mg  qd, Atorvastatin 20mg  qd, Tamsulosin 0.4mg  qd.  ALLERGIES: NKDA  PAST MEDICAL HISTORY: Dementia (progressive), bilateral inguinal hernias  (followed by urology), BPH, hyperlipidemia, rhinitis, overactive bladder, HTN, Parkinson's disease with R hand tremor/gait instability (Dx 2016), PVD/bilateral venous edema   PHYSICAL EXAM: Well nourished, HOH, pleasantly conversant but frequently off topic. Daughter Nicholas Andrews and caregivers present.  VS: 110/64, HR 72 with frequent early beats. HEENT: Nicholas Andrews, AT LUNGS:  CTA bilaterally CARDIAC:  systolic murmur loudest RUSB, diminished pedal pulses ABD: soft, obese, NABS, non-tender EXTREMITIES: No pedal edema; diminished peripheral pulses MUSCULOSKELETAL: no contractures SKIN: warm and dry NEURO:  A & O to self, place  Nicholas Gelinas NP-C

## 2018-06-01 ENCOUNTER — Telehealth (INDEPENDENT_AMBULATORY_CARE_PROVIDER_SITE_OTHER): Payer: Self-pay | Admitting: Orthopaedic Surgery

## 2018-06-01 NOTE — Telephone Encounter (Signed)
A nurse practioner that works with the patient believes that his right hip has popped out of place, she asked if we could call patients daughter to get him in sooner, she said the 12th would be fine. I did schedule with Jeneen Rinks, not sure if he should be with Dr. Lorin Mercy. [place] # [phone]

## 2018-06-01 NOTE — Telephone Encounter (Signed)
I called and spoke with Nicholas Andrews. She states that patient is walking around on hip.  She does not want to take him to ED.  Appt made with Benjiman Core, PA-C tomorrow.

## 2018-06-02 ENCOUNTER — Encounter (INDEPENDENT_AMBULATORY_CARE_PROVIDER_SITE_OTHER): Payer: Self-pay | Admitting: Orthopedic Surgery

## 2018-06-02 ENCOUNTER — Ambulatory Visit (INDEPENDENT_AMBULATORY_CARE_PROVIDER_SITE_OTHER): Payer: Medicare Other | Admitting: Surgery

## 2018-06-02 ENCOUNTER — Ambulatory Visit (INDEPENDENT_AMBULATORY_CARE_PROVIDER_SITE_OTHER): Payer: Medicare Other

## 2018-06-02 ENCOUNTER — Ambulatory Visit (INDEPENDENT_AMBULATORY_CARE_PROVIDER_SITE_OTHER): Payer: Medicare Other | Admitting: Orthopedic Surgery

## 2018-06-02 DIAGNOSIS — M25551 Pain in right hip: Secondary | ICD-10-CM | POA: Diagnosis not present

## 2018-06-02 DIAGNOSIS — I6300 Cerebral infarction due to thrombosis of unspecified precerebral artery: Secondary | ICD-10-CM

## 2018-06-02 NOTE — Progress Notes (Signed)
Office Visit Note   Patient: Nicholas Andrews           Date of Birth: Apr 30, 1918           MRN: [id number] Visit Date: 06/02/2018              Requested by: Leanna Battles, [place], [place] [address] PCP: Leanna Battles, MD  Chief Complaint  Patient presents with  . Right Hip - Pain      HPI: Patient is status post hemiarthroplasty on the right about 2 years ago.  Patient presents at this time with some redness over the greater trochanter with significant muscle atrophy.  Assessment & Plan: Visit Diagnoses:  1. Pain in right hip     Plan: Recommend patient get a hospital bed with a memory foam overlay recommend not sleeping on the right side.  There is no indication for surgical intervention at this time.  Examination patient is ambulating in a wheelchair.  He has difficulty getting from a sitting to a standing position.  Examination of the hip the greater trochanter is very prominent  Follow-Up Instructions: Return if symptoms worsen or fail to improve.   Ortho Exam  Patient is alert, oriented, no adenopathy, well-dressed, normal affect, normal respiratory effort. There is some redness over the area secondary to complete atrophy of the gluteus muscles.  Patient has no pain with internal or external rotation of the right hip he has a negative straight leg raise no sciatic symptoms.  Imaging: Xr Hip Unilat W Or W/o Pelvis 2-3 Views Right  Result Date: 06/02/2018 2 view radiographs of the right hip shows equal leg lengths there is some mild varus alignment of the hemiarthroplasty.  There is no dislocation no complicating features of the implant.  No images are attached to the encounter.  Labs: Lab Results  Component Value Date   HGBA1C 5.4 10/27/2017   LABORGA AEROCOCCUS SPECIES 03/05/2015     Lab Results  Component Value Date   ALBUMIN 3.3 (L) 10/26/2017    There is no height or weight on file to calculate BMI.  Orders:  Orders Placed This  Encounter  Procedures  . XR HIP UNILAT W OR W/O PELVIS 2-3 VIEWS RIGHT   No orders of the defined types were placed in this encounter.    Procedures: No procedures performed  Clinical Data: No additional findings.  ROS:  All other systems negative, except as noted in the HPI. Review of Systems  Objective: Vital Signs: There were no vitals taken for this visit.  Specialty Comments:  No specialty comments available.  PMFS History: Patient Active Problem List   Diagnosis Date Noted  . New onset atrial fibrillation ([place]) 10/28/2017  . Stroke-like symptom 10/26/2017  . TIA (transient ischemic attack) 10/26/2017  . Fracture of femoral neck, right, closed ([place]) 01/07/2016  . Hyperkalemia 01/07/2016  . Acute renal failure superimposed on stage 3 chronic kidney disease ([place]) 01/07/2016  . BPH (benign prostatic hyperplasia)   . Parkinson's syndrome ([place])   . Closed fracture of neck of right femur ([place])   . Hard of hearing 02/18/2012  . TUBULOVILLOUS ADENOMA, COLON 01/02/2010  . FECAL OCCULT BLOOD 10/25/2008  . PERSONAL HX COLONIC POLYPS 10/25/2008   Past Medical History:  Diagnosis Date  . Acute blood loss anemia   . BPH (benign prostatic hyperplasia)   . Cataract   . CKD (chronic kidney disease), stage III ([place])   . Closed displaced fracture of right femoral  neck with routine healing   . H/O tongue cancer   . Lewy body dementia   . Parkinson's syndrome ([place])   . Protein calorie malnutrition ([place])   . Unsteady gait     Family History  Problem Relation Age of Onset  . Dementia Mother     Past Surgical History:  Procedure Laterality Date  . APPENDECTOMY    . EYE SURGERY    . HIP ARTHROPLASTY Right 01/08/2016   Procedure: Right Hip Hemiarthroplasty;  Surgeon: Marybelle Killings, MD;  Location: [facility];  Service: Orthopedics;  Laterality: Right;  . TONSILLECTOMY AND ADENOIDECTOMY    . TRANSURETHRAL RESECTION OF PROSTATE     Social History   Occupational History  . Not  on file  Tobacco Use  . Smoking status: Former Research scientist (life sciences)  . Smokeless tobacco: Never Used  Substance and Sexual Activity  . Alcohol use: No  . Drug use: No  . Sexual activity: Never    Birth control/protection: Abstinence

## 2018-06-08 ENCOUNTER — Ambulatory Visit (INDEPENDENT_AMBULATORY_CARE_PROVIDER_SITE_OTHER): Payer: Medicare Other | Admitting: Surgery

## 2018-06-21 ENCOUNTER — Ambulatory Visit (INDEPENDENT_AMBULATORY_CARE_PROVIDER_SITE_OTHER): Payer: Medicare Other | Admitting: Orthopaedic Surgery

## 2018-07-14 DIAGNOSIS — E7849 Other hyperlipidemia: Secondary | ICD-10-CM | POA: Diagnosis not present

## 2018-07-14 DIAGNOSIS — R82998 Other abnormal findings in urine: Secondary | ICD-10-CM | POA: Diagnosis not present

## 2018-07-14 DIAGNOSIS — I10 Essential (primary) hypertension: Secondary | ICD-10-CM | POA: Diagnosis not present

## 2018-07-14 DIAGNOSIS — Z125 Encounter for screening for malignant neoplasm of prostate: Secondary | ICD-10-CM | POA: Diagnosis not present

## 2018-07-18 DIAGNOSIS — Z1389 Encounter for screening for other disorder: Secondary | ICD-10-CM | POA: Diagnosis not present

## 2018-07-18 DIAGNOSIS — R972 Elevated prostate specific antigen [PSA]: Secondary | ICD-10-CM | POA: Diagnosis not present

## 2018-07-18 DIAGNOSIS — H9193 Unspecified hearing loss, bilateral: Secondary | ICD-10-CM | POA: Diagnosis not present

## 2018-07-18 DIAGNOSIS — E7849 Other hyperlipidemia: Secondary | ICD-10-CM | POA: Diagnosis not present

## 2018-07-18 DIAGNOSIS — R600 Localized edema: Secondary | ICD-10-CM | POA: Diagnosis not present

## 2018-07-18 DIAGNOSIS — R351 Nocturia: Secondary | ICD-10-CM | POA: Diagnosis not present

## 2018-07-18 DIAGNOSIS — N183 Chronic kidney disease, stage 3 (moderate): Secondary | ICD-10-CM | POA: Diagnosis not present

## 2018-07-18 DIAGNOSIS — G20 Parkinson's disease: Secondary | ICD-10-CM | POA: Diagnosis not present

## 2018-07-18 DIAGNOSIS — Z6827 Body mass index (BMI) 27.0-27.9, adult: Secondary | ICD-10-CM | POA: Diagnosis not present

## 2018-07-18 DIAGNOSIS — Z0000 Encounter for general adult medical examination without abnormal findings: Secondary | ICD-10-CM | POA: Diagnosis not present

## 2018-07-18 DIAGNOSIS — R296 Repeated falls: Secondary | ICD-10-CM | POA: Diagnosis not present

## 2018-07-18 DIAGNOSIS — R627 Adult failure to thrive: Secondary | ICD-10-CM | POA: Diagnosis not present

## 2018-07-25 ENCOUNTER — Ambulatory Visit: Payer: Medicare Other | Admitting: Adult Health

## 2018-07-26 ENCOUNTER — Ambulatory Visit (INDEPENDENT_AMBULATORY_CARE_PROVIDER_SITE_OTHER): Payer: Medicare Other | Admitting: Orthopedic Surgery

## 2018-07-26 ENCOUNTER — Encounter (INDEPENDENT_AMBULATORY_CARE_PROVIDER_SITE_OTHER): Payer: Self-pay | Admitting: Orthopedic Surgery

## 2018-07-26 VITALS — Ht 74.0 in | Wt 206.0 lb

## 2018-07-26 DIAGNOSIS — M1711 Unilateral primary osteoarthritis, right knee: Secondary | ICD-10-CM

## 2018-07-27 ENCOUNTER — Encounter (INDEPENDENT_AMBULATORY_CARE_PROVIDER_SITE_OTHER): Payer: Self-pay | Admitting: Orthopedic Surgery

## 2018-07-27 DIAGNOSIS — M1711 Unilateral primary osteoarthritis, right knee: Secondary | ICD-10-CM | POA: Diagnosis not present

## 2018-07-27 MED ORDER — LIDOCAINE HCL 1 % IJ SOLN
5.0000 mL | INTRAMUSCULAR | Status: AC | PRN
  Administered 2018-07-27: 5 mL

## 2018-07-27 MED ORDER — METHYLPREDNISOLONE ACETATE 40 MG/ML IJ SUSP
40.0000 mg | INTRAMUSCULAR | Status: AC | PRN
  Administered 2018-07-27: 40 mg via INTRA_ARTICULAR

## 2018-07-27 NOTE — Progress Notes (Signed)
Office Visit Note   Patient: Nicholas Andrews           Date of Birth: 1918/06/22           MRN: [id number] Visit Date: 07/26/2018              Requested by: Leanna Battles, [place], [place] [address] PCP: Leanna Battles, MD  Chief Complaint  Patient presents with  . Right Knee - Pain, Weakness, Follow-up      HPI: Patient is a 84 year old gentleman complaining of right knee pain difficulty with ambulation patient has had episodes of his knee giving way did have a fall last week from his knee giving out.  Assessment & Plan: Visit Diagnoses:  1. Unilateral primary osteoarthritis, right knee     Plan: The knee was injected.  Patient is to continue with standby assistance for ambulation.  Do not feel a brace would provide any benefit from his knee giving way.  Follow-Up Instructions: Return if symptoms worsen or fail to improve.   Ortho Exam  Patient is alert, oriented, no adenopathy, well-dressed, normal affect, normal respiratory effort. Examination patient has no effusion of the right knee there is tenderness to palpation in the patellofemoral joint as well as medial lateral joint lines collaterals and cruciates are stable.  There is crepitation with range of motion.  Imaging: No results found. No images are attached to the encounter.  Labs: Lab Results  Component Value Date   HGBA1C 5.4 10/27/2017   LABORGA AEROCOCCUS SPECIES 03/05/2015     Lab Results  Component Value Date   ALBUMIN 3.3 (L) 10/26/2017    Body mass index is 26.45 kg/m.  Orders:  No orders of the defined types were placed in this encounter.  No orders of the defined types were placed in this encounter.    Procedures: Large Joint Inj: R knee on 07/27/2018 5:24 PM Indications: pain and diagnostic evaluation Details: 82 G 1.5 in needle, anteromedial approach  Arthrogram: No  Medications: 5 mL lidocaine 1 %; 40 mg methylPREDNISolone acetate 40 MG/ML Outcome: tolerated  well, no immediate complications Procedure, treatment alternatives, risks and benefits explained, specific risks discussed. Consent was given by the patient. Immediately prior to procedure a time out was called to verify the correct patient, procedure, equipment, support staff and site/side marked as required. Patient was prepped and draped in the usual sterile fashion.      Clinical Data: No additional findings.  ROS:  All other systems negative, except as noted in the HPI. Review of Systems  Objective: Vital Signs: Ht 6\' 2"  (1.88 m)   Wt 206 lb (93.4 kg)   BMI 26.45 kg/m   Specialty Comments:  No specialty comments available.  PMFS History: Patient Active Problem List   Diagnosis Date Noted  . New onset atrial fibrillation ([place]) 10/28/2017  . Stroke-like symptom 10/26/2017  . TIA (transient ischemic attack) 10/26/2017  . Fracture of femoral neck, right, closed ([place]) 01/07/2016  . Hyperkalemia 01/07/2016  . Acute renal failure superimposed on stage 3 chronic kidney disease ([place]) 01/07/2016  . BPH (benign prostatic hyperplasia)   . Parkinson's syndrome ([place])   . Closed fracture of neck of right femur ([place])   . Hard of hearing 02/18/2012  . TUBULOVILLOUS ADENOMA, COLON 01/02/2010  . FECAL OCCULT BLOOD 10/25/2008  . PERSONAL HX COLONIC POLYPS 10/25/2008   Past Medical History:  Diagnosis Date  . Acute blood loss anemia   . BPH (benign prostatic hyperplasia)   .  Cataract   . CKD (chronic kidney disease), stage III ([place])   . Closed displaced fracture of right femoral neck with routine healing   . H/O tongue cancer   . Lewy body dementia   . Parkinson's syndrome ([place])   . Protein calorie malnutrition ([place])   . Unsteady gait     Family History  Problem Relation Age of Onset  . Dementia Mother     Past Surgical History:  Procedure Laterality Date  . APPENDECTOMY    . EYE SURGERY    . HIP ARTHROPLASTY Right 01/08/2016   Procedure: Right Hip Hemiarthroplasty;   Surgeon: Marybelle Killings, MD;  Location: [facility];  Service: Orthopedics;  Laterality: Right;  . TONSILLECTOMY AND ADENOIDECTOMY    . TRANSURETHRAL RESECTION OF PROSTATE     Social History   Occupational History  . Not on file  Tobacco Use  . Smoking status: Former Research scientist (life sciences)  . Smokeless tobacco: Never Used  Substance and Sexual Activity  . Alcohol use: No  . Drug use: No  . Sexual activity: Never    Birth control/protection: Abstinence

## 2018-07-29 ENCOUNTER — Other Ambulatory Visit: Payer: Self-pay | Admitting: Neurology

## 2018-08-17 DIAGNOSIS — G4700 Insomnia, unspecified: Secondary | ICD-10-CM | POA: Diagnosis not present

## 2018-08-19 DIAGNOSIS — R3912 Poor urinary stream: Secondary | ICD-10-CM | POA: Diagnosis not present

## 2018-08-19 DIAGNOSIS — R350 Frequency of micturition: Secondary | ICD-10-CM | POA: Diagnosis not present

## 2018-09-09 DIAGNOSIS — N390 Urinary tract infection, site not specified: Secondary | ICD-10-CM | POA: Diagnosis not present

## 2018-09-09 DIAGNOSIS — R3129 Other microscopic hematuria: Secondary | ICD-10-CM | POA: Diagnosis not present

## 2018-11-16 DIAGNOSIS — N390 Urinary tract infection, site not specified: Secondary | ICD-10-CM | POA: Diagnosis not present

## 2018-11-16 DIAGNOSIS — R82998 Other abnormal findings in urine: Secondary | ICD-10-CM | POA: Diagnosis not present

## 2018-12-02 DIAGNOSIS — N390 Urinary tract infection, site not specified: Secondary | ICD-10-CM | POA: Diagnosis not present

## 2018-12-02 DIAGNOSIS — T148XXA Other injury of unspecified body region, initial encounter: Secondary | ICD-10-CM | POA: Diagnosis not present

## 2018-12-02 DIAGNOSIS — Z6828 Body mass index (BMI) 28.0-28.9, adult: Secondary | ICD-10-CM | POA: Diagnosis not present

## 2018-12-02 DIAGNOSIS — R82998 Other abnormal findings in urine: Secondary | ICD-10-CM | POA: Diagnosis not present

## 2018-12-08 DIAGNOSIS — Z6828 Body mass index (BMI) 28.0-28.9, adult: Secondary | ICD-10-CM | POA: Diagnosis not present

## 2018-12-08 DIAGNOSIS — I872 Venous insufficiency (chronic) (peripheral): Secondary | ICD-10-CM | POA: Diagnosis not present

## 2018-12-08 DIAGNOSIS — R296 Repeated falls: Secondary | ICD-10-CM | POA: Diagnosis not present

## 2018-12-08 DIAGNOSIS — T148XXD Other injury of unspecified body region, subsequent encounter: Secondary | ICD-10-CM | POA: Diagnosis not present

## 2018-12-08 DIAGNOSIS — R627 Adult failure to thrive: Secondary | ICD-10-CM | POA: Diagnosis not present

## 2018-12-08 DIAGNOSIS — G20 Parkinson's disease: Secondary | ICD-10-CM | POA: Diagnosis not present

## 2018-12-08 DIAGNOSIS — N390 Urinary tract infection, site not specified: Secondary | ICD-10-CM | POA: Diagnosis not present

## 2018-12-26 DIAGNOSIS — I872 Venous insufficiency (chronic) (peripheral): Secondary | ICD-10-CM | POA: Diagnosis not present

## 2018-12-26 DIAGNOSIS — Z7982 Long term (current) use of aspirin: Secondary | ICD-10-CM | POA: Diagnosis not present

## 2018-12-26 DIAGNOSIS — Z96641 Presence of right artificial hip joint: Secondary | ICD-10-CM | POA: Diagnosis not present

## 2018-12-26 DIAGNOSIS — N183 Chronic kidney disease, stage 3 (moderate): Secondary | ICD-10-CM | POA: Diagnosis not present

## 2018-12-26 DIAGNOSIS — Z86010 Personal history of colonic polyps: Secondary | ICD-10-CM | POA: Diagnosis not present

## 2018-12-26 DIAGNOSIS — I739 Peripheral vascular disease, unspecified: Secondary | ICD-10-CM | POA: Diagnosis not present

## 2018-12-26 DIAGNOSIS — G20 Parkinson's disease: Secondary | ICD-10-CM | POA: Diagnosis not present

## 2018-12-26 DIAGNOSIS — H6123 Impacted cerumen, bilateral: Secondary | ICD-10-CM | POA: Diagnosis not present

## 2018-12-26 DIAGNOSIS — Z87440 Personal history of urinary (tract) infections: Secondary | ICD-10-CM | POA: Diagnosis not present

## 2018-12-26 DIAGNOSIS — H9193 Unspecified hearing loss, bilateral: Secondary | ICD-10-CM | POA: Diagnosis not present

## 2018-12-26 DIAGNOSIS — F0280 Dementia in other diseases classified elsewhere without behavioral disturbance: Secondary | ICD-10-CM | POA: Diagnosis not present

## 2018-12-26 DIAGNOSIS — E785 Hyperlipidemia, unspecified: Secondary | ICD-10-CM | POA: Diagnosis not present

## 2018-12-26 DIAGNOSIS — I7389 Other specified peripheral vascular diseases: Secondary | ICD-10-CM | POA: Diagnosis not present

## 2018-12-26 DIAGNOSIS — Z9181 History of falling: Secondary | ICD-10-CM | POA: Diagnosis not present

## 2018-12-26 DIAGNOSIS — E7849 Other hyperlipidemia: Secondary | ICD-10-CM | POA: Diagnosis not present

## 2018-12-26 DIAGNOSIS — N3281 Overactive bladder: Secondary | ICD-10-CM | POA: Diagnosis not present

## 2018-12-26 DIAGNOSIS — Z87891 Personal history of nicotine dependence: Secondary | ICD-10-CM | POA: Diagnosis not present

## 2018-12-26 DIAGNOSIS — G4700 Insomnia, unspecified: Secondary | ICD-10-CM | POA: Diagnosis not present

## 2018-12-26 DIAGNOSIS — I129 Hypertensive chronic kidney disease with stage 1 through stage 4 chronic kidney disease, or unspecified chronic kidney disease: Secondary | ICD-10-CM | POA: Diagnosis not present

## 2018-12-26 DIAGNOSIS — G4709 Other insomnia: Secondary | ICD-10-CM | POA: Diagnosis not present

## 2018-12-26 DIAGNOSIS — N400 Enlarged prostate without lower urinary tract symptoms: Secondary | ICD-10-CM | POA: Diagnosis not present

## 2019-01-02 DIAGNOSIS — N183 Chronic kidney disease, stage 3 (moderate): Secondary | ICD-10-CM | POA: Diagnosis not present

## 2019-01-02 DIAGNOSIS — I872 Venous insufficiency (chronic) (peripheral): Secondary | ICD-10-CM | POA: Diagnosis not present

## 2019-01-02 DIAGNOSIS — F0280 Dementia in other diseases classified elsewhere without behavioral disturbance: Secondary | ICD-10-CM | POA: Diagnosis not present

## 2019-01-02 DIAGNOSIS — G20 Parkinson's disease: Secondary | ICD-10-CM | POA: Diagnosis not present

## 2019-01-02 DIAGNOSIS — I739 Peripheral vascular disease, unspecified: Secondary | ICD-10-CM | POA: Diagnosis not present

## 2019-01-02 DIAGNOSIS — I129 Hypertensive chronic kidney disease with stage 1 through stage 4 chronic kidney disease, or unspecified chronic kidney disease: Secondary | ICD-10-CM | POA: Diagnosis not present

## 2019-01-06 DIAGNOSIS — R82998 Other abnormal findings in urine: Secondary | ICD-10-CM | POA: Diagnosis not present

## 2019-01-12 DIAGNOSIS — G20 Parkinson's disease: Secondary | ICD-10-CM | POA: Diagnosis not present

## 2019-01-12 DIAGNOSIS — I739 Peripheral vascular disease, unspecified: Secondary | ICD-10-CM | POA: Diagnosis not present

## 2019-01-12 DIAGNOSIS — N183 Chronic kidney disease, stage 3 (moderate): Secondary | ICD-10-CM | POA: Diagnosis not present

## 2019-01-12 DIAGNOSIS — I129 Hypertensive chronic kidney disease with stage 1 through stage 4 chronic kidney disease, or unspecified chronic kidney disease: Secondary | ICD-10-CM | POA: Diagnosis not present

## 2019-01-12 DIAGNOSIS — I872 Venous insufficiency (chronic) (peripheral): Secondary | ICD-10-CM | POA: Diagnosis not present

## 2019-01-12 DIAGNOSIS — F0280 Dementia in other diseases classified elsewhere without behavioral disturbance: Secondary | ICD-10-CM | POA: Diagnosis not present

## 2019-01-14 DIAGNOSIS — G2581 Restless legs syndrome: Secondary | ICD-10-CM | POA: Diagnosis not present

## 2019-01-14 DIAGNOSIS — R630 Anorexia: Secondary | ICD-10-CM | POA: Diagnosis not present

## 2019-01-14 DIAGNOSIS — G3183 Dementia with Lewy bodies: Secondary | ICD-10-CM | POA: Diagnosis not present

## 2019-01-14 DIAGNOSIS — I4891 Unspecified atrial fibrillation: Secondary | ICD-10-CM | POA: Diagnosis not present

## 2019-01-14 DIAGNOSIS — N183 Chronic kidney disease, stage 3 (moderate): Secondary | ICD-10-CM | POA: Diagnosis not present

## 2019-01-14 DIAGNOSIS — R1310 Dysphagia, unspecified: Secondary | ICD-10-CM | POA: Diagnosis not present

## 2019-01-14 DIAGNOSIS — G20 Parkinson's disease: Secondary | ICD-10-CM | POA: Diagnosis not present

## 2019-01-14 DIAGNOSIS — I679 Cerebrovascular disease, unspecified: Secondary | ICD-10-CM | POA: Diagnosis not present

## 2019-01-14 DIAGNOSIS — N401 Enlarged prostate with lower urinary tract symptoms: Secondary | ICD-10-CM | POA: Diagnosis not present

## 2019-01-16 DIAGNOSIS — R1310 Dysphagia, unspecified: Secondary | ICD-10-CM | POA: Diagnosis not present

## 2019-01-16 DIAGNOSIS — I4891 Unspecified atrial fibrillation: Secondary | ICD-10-CM | POA: Diagnosis not present

## 2019-01-16 DIAGNOSIS — N183 Chronic kidney disease, stage 3 (moderate): Secondary | ICD-10-CM | POA: Diagnosis not present

## 2019-01-16 DIAGNOSIS — I679 Cerebrovascular disease, unspecified: Secondary | ICD-10-CM | POA: Diagnosis not present

## 2019-01-16 DIAGNOSIS — R630 Anorexia: Secondary | ICD-10-CM | POA: Diagnosis not present

## 2019-01-16 DIAGNOSIS — G3183 Dementia with Lewy bodies: Secondary | ICD-10-CM | POA: Diagnosis not present

## 2019-01-17 DIAGNOSIS — R630 Anorexia: Secondary | ICD-10-CM | POA: Diagnosis not present

## 2019-01-17 DIAGNOSIS — G3183 Dementia with Lewy bodies: Secondary | ICD-10-CM | POA: Diagnosis not present

## 2019-01-17 DIAGNOSIS — I4891 Unspecified atrial fibrillation: Secondary | ICD-10-CM | POA: Diagnosis not present

## 2019-01-17 DIAGNOSIS — R1310 Dysphagia, unspecified: Secondary | ICD-10-CM | POA: Diagnosis not present

## 2019-01-17 DIAGNOSIS — I679 Cerebrovascular disease, unspecified: Secondary | ICD-10-CM | POA: Diagnosis not present

## 2019-01-17 DIAGNOSIS — N183 Chronic kidney disease, stage 3 (moderate): Secondary | ICD-10-CM | POA: Diagnosis not present

## 2019-01-26 DIAGNOSIS — G3183 Dementia with Lewy bodies: Secondary | ICD-10-CM | POA: Diagnosis not present

## 2019-01-26 DIAGNOSIS — R630 Anorexia: Secondary | ICD-10-CM | POA: Diagnosis not present

## 2019-01-26 DIAGNOSIS — R1310 Dysphagia, unspecified: Secondary | ICD-10-CM | POA: Diagnosis not present

## 2019-01-26 DIAGNOSIS — N183 Chronic kidney disease, stage 3 (moderate): Secondary | ICD-10-CM | POA: Diagnosis not present

## 2019-01-26 DIAGNOSIS — I4891 Unspecified atrial fibrillation: Secondary | ICD-10-CM | POA: Diagnosis not present

## 2019-01-26 DIAGNOSIS — I679 Cerebrovascular disease, unspecified: Secondary | ICD-10-CM | POA: Diagnosis not present

## 2019-01-28 DIAGNOSIS — N183 Chronic kidney disease, stage 3 (moderate): Secondary | ICD-10-CM | POA: Diagnosis not present

## 2019-01-28 DIAGNOSIS — N401 Enlarged prostate with lower urinary tract symptoms: Secondary | ICD-10-CM | POA: Diagnosis not present

## 2019-01-28 DIAGNOSIS — G3183 Dementia with Lewy bodies: Secondary | ICD-10-CM | POA: Diagnosis not present

## 2019-01-28 DIAGNOSIS — R1310 Dysphagia, unspecified: Secondary | ICD-10-CM | POA: Diagnosis not present

## 2019-01-28 DIAGNOSIS — I679 Cerebrovascular disease, unspecified: Secondary | ICD-10-CM | POA: Diagnosis not present

## 2019-01-28 DIAGNOSIS — G2581 Restless legs syndrome: Secondary | ICD-10-CM | POA: Diagnosis not present

## 2019-01-28 DIAGNOSIS — R630 Anorexia: Secondary | ICD-10-CM | POA: Diagnosis not present

## 2019-01-28 DIAGNOSIS — I4891 Unspecified atrial fibrillation: Secondary | ICD-10-CM | POA: Diagnosis not present

## 2019-01-28 DIAGNOSIS — G20 Parkinson's disease: Secondary | ICD-10-CM | POA: Diagnosis not present

## 2019-02-03 DIAGNOSIS — I679 Cerebrovascular disease, unspecified: Secondary | ICD-10-CM | POA: Diagnosis not present

## 2019-02-03 DIAGNOSIS — R630 Anorexia: Secondary | ICD-10-CM | POA: Diagnosis not present

## 2019-02-03 DIAGNOSIS — R1310 Dysphagia, unspecified: Secondary | ICD-10-CM | POA: Diagnosis not present

## 2019-02-03 DIAGNOSIS — G3183 Dementia with Lewy bodies: Secondary | ICD-10-CM | POA: Diagnosis not present

## 2019-02-03 DIAGNOSIS — I4891 Unspecified atrial fibrillation: Secondary | ICD-10-CM | POA: Diagnosis not present

## 2019-02-03 DIAGNOSIS — N183 Chronic kidney disease, stage 3 (moderate): Secondary | ICD-10-CM | POA: Diagnosis not present

## 2019-02-05 DIAGNOSIS — R630 Anorexia: Secondary | ICD-10-CM | POA: Diagnosis not present

## 2019-02-05 DIAGNOSIS — R1310 Dysphagia, unspecified: Secondary | ICD-10-CM | POA: Diagnosis not present

## 2019-02-05 DIAGNOSIS — G3183 Dementia with Lewy bodies: Secondary | ICD-10-CM | POA: Diagnosis not present

## 2019-02-05 DIAGNOSIS — N183 Chronic kidney disease, stage 3 (moderate): Secondary | ICD-10-CM | POA: Diagnosis not present

## 2019-02-05 DIAGNOSIS — I4891 Unspecified atrial fibrillation: Secondary | ICD-10-CM | POA: Diagnosis not present

## 2019-02-05 DIAGNOSIS — I679 Cerebrovascular disease, unspecified: Secondary | ICD-10-CM | POA: Diagnosis not present

## 2019-02-06 DIAGNOSIS — N183 Chronic kidney disease, stage 3 (moderate): Secondary | ICD-10-CM | POA: Diagnosis not present

## 2019-02-06 DIAGNOSIS — R630 Anorexia: Secondary | ICD-10-CM | POA: Diagnosis not present

## 2019-02-06 DIAGNOSIS — I4891 Unspecified atrial fibrillation: Secondary | ICD-10-CM | POA: Diagnosis not present

## 2019-02-06 DIAGNOSIS — G3183 Dementia with Lewy bodies: Secondary | ICD-10-CM | POA: Diagnosis not present

## 2019-02-06 DIAGNOSIS — I679 Cerebrovascular disease, unspecified: Secondary | ICD-10-CM | POA: Diagnosis not present

## 2019-02-06 DIAGNOSIS — R1310 Dysphagia, unspecified: Secondary | ICD-10-CM | POA: Diagnosis not present

## 2019-02-16 DIAGNOSIS — I4891 Unspecified atrial fibrillation: Secondary | ICD-10-CM | POA: Diagnosis not present

## 2019-02-16 DIAGNOSIS — I679 Cerebrovascular disease, unspecified: Secondary | ICD-10-CM | POA: Diagnosis not present

## 2019-02-16 DIAGNOSIS — N183 Chronic kidney disease, stage 3 (moderate): Secondary | ICD-10-CM | POA: Diagnosis not present

## 2019-02-16 DIAGNOSIS — G3183 Dementia with Lewy bodies: Secondary | ICD-10-CM | POA: Diagnosis not present

## 2019-02-16 DIAGNOSIS — R1310 Dysphagia, unspecified: Secondary | ICD-10-CM | POA: Diagnosis not present

## 2019-02-16 DIAGNOSIS — R630 Anorexia: Secondary | ICD-10-CM | POA: Diagnosis not present

## 2019-02-17 DIAGNOSIS — R0689 Other abnormalities of breathing: Secondary | ICD-10-CM | POA: Diagnosis not present

## 2019-02-17 DIAGNOSIS — I679 Cerebrovascular disease, unspecified: Secondary | ICD-10-CM | POA: Diagnosis not present

## 2019-02-17 DIAGNOSIS — R0902 Hypoxemia: Secondary | ICD-10-CM | POA: Diagnosis not present

## 2019-02-17 DIAGNOSIS — I4891 Unspecified atrial fibrillation: Secondary | ICD-10-CM | POA: Diagnosis not present

## 2019-02-17 DIAGNOSIS — R404 Transient alteration of awareness: Secondary | ICD-10-CM | POA: Diagnosis not present

## 2019-02-17 DIAGNOSIS — R4020 Unspecified coma: Secondary | ICD-10-CM | POA: Diagnosis not present

## 2019-02-17 DIAGNOSIS — I499 Cardiac arrhythmia, unspecified: Secondary | ICD-10-CM | POA: Diagnosis not present

## 2019-02-17 DIAGNOSIS — N183 Chronic kidney disease, stage 3 (moderate): Secondary | ICD-10-CM | POA: Diagnosis not present

## 2019-02-17 DIAGNOSIS — R1310 Dysphagia, unspecified: Secondary | ICD-10-CM | POA: Diagnosis not present

## 2019-02-17 DIAGNOSIS — G3183 Dementia with Lewy bodies: Secondary | ICD-10-CM | POA: Diagnosis not present

## 2019-02-17 DIAGNOSIS — R630 Anorexia: Secondary | ICD-10-CM | POA: Diagnosis not present

## 2019-02-20 DIAGNOSIS — I4891 Unspecified atrial fibrillation: Secondary | ICD-10-CM | POA: Diagnosis not present

## 2019-02-20 DIAGNOSIS — N390 Urinary tract infection, site not specified: Secondary | ICD-10-CM | POA: Diagnosis not present

## 2019-02-20 DIAGNOSIS — G3183 Dementia with Lewy bodies: Secondary | ICD-10-CM | POA: Diagnosis not present

## 2019-02-20 DIAGNOSIS — R1310 Dysphagia, unspecified: Secondary | ICD-10-CM | POA: Diagnosis not present

## 2019-02-20 DIAGNOSIS — I679 Cerebrovascular disease, unspecified: Secondary | ICD-10-CM | POA: Diagnosis not present

## 2019-02-20 DIAGNOSIS — N183 Chronic kidney disease, stage 3 (moderate): Secondary | ICD-10-CM | POA: Diagnosis not present

## 2019-02-20 DIAGNOSIS — R630 Anorexia: Secondary | ICD-10-CM | POA: Diagnosis not present

## 2019-02-20 DIAGNOSIS — R197 Diarrhea, unspecified: Secondary | ICD-10-CM | POA: Diagnosis not present

## 2019-02-23 DIAGNOSIS — G3183 Dementia with Lewy bodies: Secondary | ICD-10-CM | POA: Diagnosis not present

## 2019-02-23 DIAGNOSIS — I679 Cerebrovascular disease, unspecified: Secondary | ICD-10-CM | POA: Diagnosis not present

## 2019-02-23 DIAGNOSIS — R630 Anorexia: Secondary | ICD-10-CM | POA: Diagnosis not present

## 2019-02-23 DIAGNOSIS — R1310 Dysphagia, unspecified: Secondary | ICD-10-CM | POA: Diagnosis not present

## 2019-02-23 DIAGNOSIS — I4891 Unspecified atrial fibrillation: Secondary | ICD-10-CM | POA: Diagnosis not present

## 2019-02-23 DIAGNOSIS — N183 Chronic kidney disease, stage 3 (moderate): Secondary | ICD-10-CM | POA: Diagnosis not present

## 2019-02-26 DIAGNOSIS — I679 Cerebrovascular disease, unspecified: Secondary | ICD-10-CM | POA: Diagnosis not present

## 2019-02-26 DIAGNOSIS — N401 Enlarged prostate with lower urinary tract symptoms: Secondary | ICD-10-CM | POA: Diagnosis not present

## 2019-02-26 DIAGNOSIS — G3183 Dementia with Lewy bodies: Secondary | ICD-10-CM | POA: Diagnosis not present

## 2019-02-26 DIAGNOSIS — R630 Anorexia: Secondary | ICD-10-CM | POA: Diagnosis not present

## 2019-02-26 DIAGNOSIS — I4891 Unspecified atrial fibrillation: Secondary | ICD-10-CM | POA: Diagnosis not present

## 2019-02-26 DIAGNOSIS — N183 Chronic kidney disease, stage 3 (moderate): Secondary | ICD-10-CM | POA: Diagnosis not present

## 2019-02-26 DIAGNOSIS — G2581 Restless legs syndrome: Secondary | ICD-10-CM | POA: Diagnosis not present

## 2019-02-26 DIAGNOSIS — G20 Parkinson's disease: Secondary | ICD-10-CM | POA: Diagnosis not present

## 2019-02-26 DIAGNOSIS — R1310 Dysphagia, unspecified: Secondary | ICD-10-CM | POA: Diagnosis not present

## 2019-02-27 DIAGNOSIS — N183 Chronic kidney disease, stage 3 (moderate): Secondary | ICD-10-CM | POA: Diagnosis not present

## 2019-02-27 DIAGNOSIS — R630 Anorexia: Secondary | ICD-10-CM | POA: Diagnosis not present

## 2019-02-27 DIAGNOSIS — R1310 Dysphagia, unspecified: Secondary | ICD-10-CM | POA: Diagnosis not present

## 2019-02-27 DIAGNOSIS — I4891 Unspecified atrial fibrillation: Secondary | ICD-10-CM | POA: Diagnosis not present

## 2019-02-27 DIAGNOSIS — I679 Cerebrovascular disease, unspecified: Secondary | ICD-10-CM | POA: Diagnosis not present

## 2019-02-27 DIAGNOSIS — G3183 Dementia with Lewy bodies: Secondary | ICD-10-CM | POA: Diagnosis not present

## 2019-02-28 DIAGNOSIS — G3183 Dementia with Lewy bodies: Secondary | ICD-10-CM | POA: Diagnosis not present

## 2019-02-28 DIAGNOSIS — I4891 Unspecified atrial fibrillation: Secondary | ICD-10-CM | POA: Diagnosis not present

## 2019-02-28 DIAGNOSIS — N183 Chronic kidney disease, stage 3 (moderate): Secondary | ICD-10-CM | POA: Diagnosis not present

## 2019-02-28 DIAGNOSIS — R1310 Dysphagia, unspecified: Secondary | ICD-10-CM | POA: Diagnosis not present

## 2019-02-28 DIAGNOSIS — I679 Cerebrovascular disease, unspecified: Secondary | ICD-10-CM | POA: Diagnosis not present

## 2019-02-28 DIAGNOSIS — R630 Anorexia: Secondary | ICD-10-CM | POA: Diagnosis not present

## 2019-03-02 DIAGNOSIS — I679 Cerebrovascular disease, unspecified: Secondary | ICD-10-CM | POA: Diagnosis not present

## 2019-03-02 DIAGNOSIS — R1310 Dysphagia, unspecified: Secondary | ICD-10-CM | POA: Diagnosis not present

## 2019-03-02 DIAGNOSIS — I4891 Unspecified atrial fibrillation: Secondary | ICD-10-CM | POA: Diagnosis not present

## 2019-03-02 DIAGNOSIS — R630 Anorexia: Secondary | ICD-10-CM | POA: Diagnosis not present

## 2019-03-02 DIAGNOSIS — N183 Chronic kidney disease, stage 3 (moderate): Secondary | ICD-10-CM | POA: Diagnosis not present

## 2019-03-02 DIAGNOSIS — G3183 Dementia with Lewy bodies: Secondary | ICD-10-CM | POA: Diagnosis not present

## 2019-03-03 DIAGNOSIS — I679 Cerebrovascular disease, unspecified: Secondary | ICD-10-CM | POA: Diagnosis not present

## 2019-03-03 DIAGNOSIS — N183 Chronic kidney disease, stage 3 (moderate): Secondary | ICD-10-CM | POA: Diagnosis not present

## 2019-03-03 DIAGNOSIS — R1310 Dysphagia, unspecified: Secondary | ICD-10-CM | POA: Diagnosis not present

## 2019-03-03 DIAGNOSIS — I4891 Unspecified atrial fibrillation: Secondary | ICD-10-CM | POA: Diagnosis not present

## 2019-03-03 DIAGNOSIS — R630 Anorexia: Secondary | ICD-10-CM | POA: Diagnosis not present

## 2019-03-03 DIAGNOSIS — G3183 Dementia with Lewy bodies: Secondary | ICD-10-CM | POA: Diagnosis not present

## 2019-03-07 DIAGNOSIS — N183 Chronic kidney disease, stage 3 (moderate): Secondary | ICD-10-CM | POA: Diagnosis not present

## 2019-03-07 DIAGNOSIS — I679 Cerebrovascular disease, unspecified: Secondary | ICD-10-CM | POA: Diagnosis not present

## 2019-03-07 DIAGNOSIS — I4891 Unspecified atrial fibrillation: Secondary | ICD-10-CM | POA: Diagnosis not present

## 2019-03-07 DIAGNOSIS — G3183 Dementia with Lewy bodies: Secondary | ICD-10-CM | POA: Diagnosis not present

## 2019-03-07 DIAGNOSIS — R1310 Dysphagia, unspecified: Secondary | ICD-10-CM | POA: Diagnosis not present

## 2019-03-07 DIAGNOSIS — R630 Anorexia: Secondary | ICD-10-CM | POA: Diagnosis not present

## 2019-03-09 DIAGNOSIS — N183 Chronic kidney disease, stage 3 (moderate): Secondary | ICD-10-CM | POA: Diagnosis not present

## 2019-03-09 DIAGNOSIS — R1310 Dysphagia, unspecified: Secondary | ICD-10-CM | POA: Diagnosis not present

## 2019-03-09 DIAGNOSIS — G3183 Dementia with Lewy bodies: Secondary | ICD-10-CM | POA: Diagnosis not present

## 2019-03-09 DIAGNOSIS — I679 Cerebrovascular disease, unspecified: Secondary | ICD-10-CM | POA: Diagnosis not present

## 2019-03-09 DIAGNOSIS — R630 Anorexia: Secondary | ICD-10-CM | POA: Diagnosis not present

## 2019-03-09 DIAGNOSIS — I4891 Unspecified atrial fibrillation: Secondary | ICD-10-CM | POA: Diagnosis not present

## 2019-03-14 DIAGNOSIS — R1310 Dysphagia, unspecified: Secondary | ICD-10-CM | POA: Diagnosis not present

## 2019-03-14 DIAGNOSIS — N183 Chronic kidney disease, stage 3 (moderate): Secondary | ICD-10-CM | POA: Diagnosis not present

## 2019-03-14 DIAGNOSIS — R630 Anorexia: Secondary | ICD-10-CM | POA: Diagnosis not present

## 2019-03-14 DIAGNOSIS — G3183 Dementia with Lewy bodies: Secondary | ICD-10-CM | POA: Diagnosis not present

## 2019-03-14 DIAGNOSIS — I679 Cerebrovascular disease, unspecified: Secondary | ICD-10-CM | POA: Diagnosis not present

## 2019-03-14 DIAGNOSIS — I4891 Unspecified atrial fibrillation: Secondary | ICD-10-CM | POA: Diagnosis not present

## 2019-03-16 DIAGNOSIS — R1310 Dysphagia, unspecified: Secondary | ICD-10-CM | POA: Diagnosis not present

## 2019-03-16 DIAGNOSIS — G3183 Dementia with Lewy bodies: Secondary | ICD-10-CM | POA: Diagnosis not present

## 2019-03-16 DIAGNOSIS — I679 Cerebrovascular disease, unspecified: Secondary | ICD-10-CM | POA: Diagnosis not present

## 2019-03-16 DIAGNOSIS — I4891 Unspecified atrial fibrillation: Secondary | ICD-10-CM | POA: Diagnosis not present

## 2019-03-16 DIAGNOSIS — N183 Chronic kidney disease, stage 3 (moderate): Secondary | ICD-10-CM | POA: Diagnosis not present

## 2019-03-16 DIAGNOSIS — R630 Anorexia: Secondary | ICD-10-CM | POA: Diagnosis not present

## 2019-03-21 DIAGNOSIS — I679 Cerebrovascular disease, unspecified: Secondary | ICD-10-CM | POA: Diagnosis not present

## 2019-03-21 DIAGNOSIS — R1310 Dysphagia, unspecified: Secondary | ICD-10-CM | POA: Diagnosis not present

## 2019-03-21 DIAGNOSIS — R630 Anorexia: Secondary | ICD-10-CM | POA: Diagnosis not present

## 2019-03-21 DIAGNOSIS — N183 Chronic kidney disease, stage 3 (moderate): Secondary | ICD-10-CM | POA: Diagnosis not present

## 2019-03-21 DIAGNOSIS — G3183 Dementia with Lewy bodies: Secondary | ICD-10-CM | POA: Diagnosis not present

## 2019-03-21 DIAGNOSIS — I4891 Unspecified atrial fibrillation: Secondary | ICD-10-CM | POA: Diagnosis not present

## 2019-03-22 DIAGNOSIS — G3183 Dementia with Lewy bodies: Secondary | ICD-10-CM | POA: Diagnosis not present

## 2019-03-22 DIAGNOSIS — I679 Cerebrovascular disease, unspecified: Secondary | ICD-10-CM | POA: Diagnosis not present

## 2019-03-22 DIAGNOSIS — N183 Chronic kidney disease, stage 3 (moderate): Secondary | ICD-10-CM | POA: Diagnosis not present

## 2019-03-22 DIAGNOSIS — I4891 Unspecified atrial fibrillation: Secondary | ICD-10-CM | POA: Diagnosis not present

## 2019-03-22 DIAGNOSIS — R1310 Dysphagia, unspecified: Secondary | ICD-10-CM | POA: Diagnosis not present

## 2019-03-22 DIAGNOSIS — R630 Anorexia: Secondary | ICD-10-CM | POA: Diagnosis not present

## 2019-03-23 DIAGNOSIS — G3183 Dementia with Lewy bodies: Secondary | ICD-10-CM | POA: Diagnosis not present

## 2019-03-23 DIAGNOSIS — R1310 Dysphagia, unspecified: Secondary | ICD-10-CM | POA: Diagnosis not present

## 2019-03-23 DIAGNOSIS — R630 Anorexia: Secondary | ICD-10-CM | POA: Diagnosis not present

## 2019-03-23 DIAGNOSIS — I4891 Unspecified atrial fibrillation: Secondary | ICD-10-CM | POA: Diagnosis not present

## 2019-03-23 DIAGNOSIS — N183 Chronic kidney disease, stage 3 (moderate): Secondary | ICD-10-CM | POA: Diagnosis not present

## 2019-03-23 DIAGNOSIS — I679 Cerebrovascular disease, unspecified: Secondary | ICD-10-CM | POA: Diagnosis not present

## 2019-03-27 DIAGNOSIS — I4891 Unspecified atrial fibrillation: Secondary | ICD-10-CM | POA: Diagnosis not present

## 2019-03-27 DIAGNOSIS — R630 Anorexia: Secondary | ICD-10-CM | POA: Diagnosis not present

## 2019-03-27 DIAGNOSIS — R1310 Dysphagia, unspecified: Secondary | ICD-10-CM | POA: Diagnosis not present

## 2019-03-27 DIAGNOSIS — N183 Chronic kidney disease, stage 3 (moderate): Secondary | ICD-10-CM | POA: Diagnosis not present

## 2019-03-27 DIAGNOSIS — G3183 Dementia with Lewy bodies: Secondary | ICD-10-CM | POA: Diagnosis not present

## 2019-03-27 DIAGNOSIS — I679 Cerebrovascular disease, unspecified: Secondary | ICD-10-CM | POA: Diagnosis not present

## 2019-03-28 DIAGNOSIS — G3183 Dementia with Lewy bodies: Secondary | ICD-10-CM | POA: Diagnosis not present

## 2019-03-28 DIAGNOSIS — R1310 Dysphagia, unspecified: Secondary | ICD-10-CM | POA: Diagnosis not present

## 2019-03-28 DIAGNOSIS — I679 Cerebrovascular disease, unspecified: Secondary | ICD-10-CM | POA: Diagnosis not present

## 2019-03-28 DIAGNOSIS — I4891 Unspecified atrial fibrillation: Secondary | ICD-10-CM | POA: Diagnosis not present

## 2019-03-28 DIAGNOSIS — R630 Anorexia: Secondary | ICD-10-CM | POA: Diagnosis not present

## 2019-03-28 DIAGNOSIS — N183 Chronic kidney disease, stage 3 (moderate): Secondary | ICD-10-CM | POA: Diagnosis not present

## 2019-03-29 DIAGNOSIS — I679 Cerebrovascular disease, unspecified: Secondary | ICD-10-CM | POA: Diagnosis not present

## 2019-03-29 DIAGNOSIS — G2581 Restless legs syndrome: Secondary | ICD-10-CM | POA: Diagnosis not present

## 2019-03-29 DIAGNOSIS — N401 Enlarged prostate with lower urinary tract symptoms: Secondary | ICD-10-CM | POA: Diagnosis not present

## 2019-03-29 DIAGNOSIS — R1310 Dysphagia, unspecified: Secondary | ICD-10-CM | POA: Diagnosis not present

## 2019-03-29 DIAGNOSIS — R630 Anorexia: Secondary | ICD-10-CM | POA: Diagnosis not present

## 2019-03-29 DIAGNOSIS — I4891 Unspecified atrial fibrillation: Secondary | ICD-10-CM | POA: Diagnosis not present

## 2019-03-29 DIAGNOSIS — G3183 Dementia with Lewy bodies: Secondary | ICD-10-CM | POA: Diagnosis not present

## 2019-03-29 DIAGNOSIS — N183 Chronic kidney disease, stage 3 (moderate): Secondary | ICD-10-CM | POA: Diagnosis not present

## 2019-03-29 DIAGNOSIS — G20 Parkinson's disease: Secondary | ICD-10-CM | POA: Diagnosis not present

## 2019-03-30 DIAGNOSIS — R1310 Dysphagia, unspecified: Secondary | ICD-10-CM | POA: Diagnosis not present

## 2019-03-30 DIAGNOSIS — G3183 Dementia with Lewy bodies: Secondary | ICD-10-CM | POA: Diagnosis not present

## 2019-03-30 DIAGNOSIS — R630 Anorexia: Secondary | ICD-10-CM | POA: Diagnosis not present

## 2019-03-30 DIAGNOSIS — I4891 Unspecified atrial fibrillation: Secondary | ICD-10-CM | POA: Diagnosis not present

## 2019-03-30 DIAGNOSIS — N183 Chronic kidney disease, stage 3 (moderate): Secondary | ICD-10-CM | POA: Diagnosis not present

## 2019-03-30 DIAGNOSIS — I679 Cerebrovascular disease, unspecified: Secondary | ICD-10-CM | POA: Diagnosis not present

## 2019-04-04 DIAGNOSIS — R1310 Dysphagia, unspecified: Secondary | ICD-10-CM | POA: Diagnosis not present

## 2019-04-04 DIAGNOSIS — I4891 Unspecified atrial fibrillation: Secondary | ICD-10-CM | POA: Diagnosis not present

## 2019-04-04 DIAGNOSIS — I679 Cerebrovascular disease, unspecified: Secondary | ICD-10-CM | POA: Diagnosis not present

## 2019-04-04 DIAGNOSIS — R630 Anorexia: Secondary | ICD-10-CM | POA: Diagnosis not present

## 2019-04-04 DIAGNOSIS — N183 Chronic kidney disease, stage 3 (moderate): Secondary | ICD-10-CM | POA: Diagnosis not present

## 2019-04-04 DIAGNOSIS — G3183 Dementia with Lewy bodies: Secondary | ICD-10-CM | POA: Diagnosis not present

## 2019-04-07 DIAGNOSIS — I4891 Unspecified atrial fibrillation: Secondary | ICD-10-CM | POA: Diagnosis not present

## 2019-04-07 DIAGNOSIS — I679 Cerebrovascular disease, unspecified: Secondary | ICD-10-CM | POA: Diagnosis not present

## 2019-04-07 DIAGNOSIS — R630 Anorexia: Secondary | ICD-10-CM | POA: Diagnosis not present

## 2019-04-07 DIAGNOSIS — G3183 Dementia with Lewy bodies: Secondary | ICD-10-CM | POA: Diagnosis not present

## 2019-04-07 DIAGNOSIS — N183 Chronic kidney disease, stage 3 (moderate): Secondary | ICD-10-CM | POA: Diagnosis not present

## 2019-04-07 DIAGNOSIS — R1310 Dysphagia, unspecified: Secondary | ICD-10-CM | POA: Diagnosis not present

## 2019-04-28 DEATH — deceased

## 2019-07-15 IMAGING — CT CT HEAD W/O CM
3 of 4 series · 15 of 47 positions shown, 18 images · non-contrast
Comparison: None.

CLINICAL DATA: Altered level of consciousness with unintelligible
speech.

EXAM:
CT HEAD WITHOUT CONTRAST
TECHNIQUE: Contiguous axial images were obtained from the base of the skull
through the vertex without intravenous contrast.

[Series 4: head 2.0 h70h · axial · 0.45mm/px · z∈[-77,+51]mm · 9 of 80 slices shown, 12 images]
[im 8/80  brain]
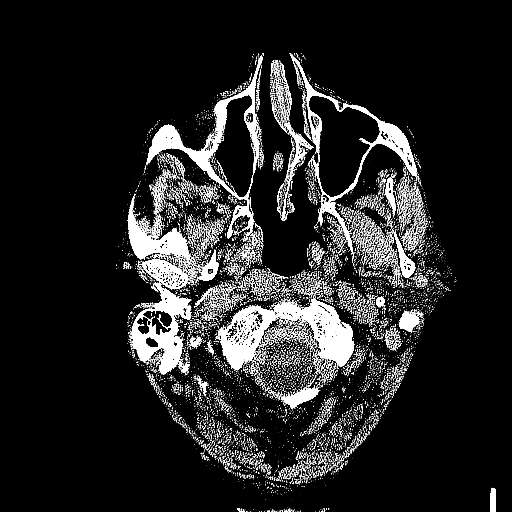
[im 8/80  bone]
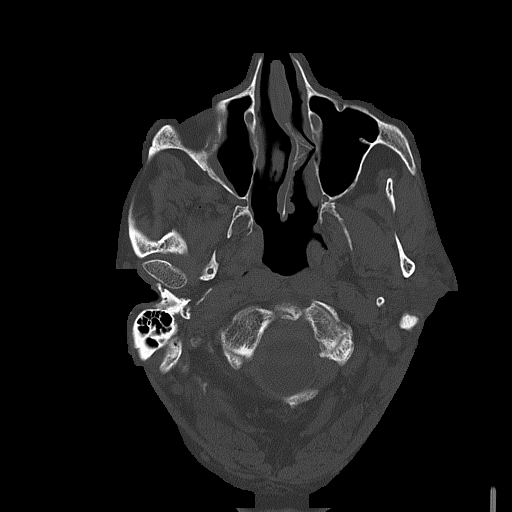
[im 16/80  brain]
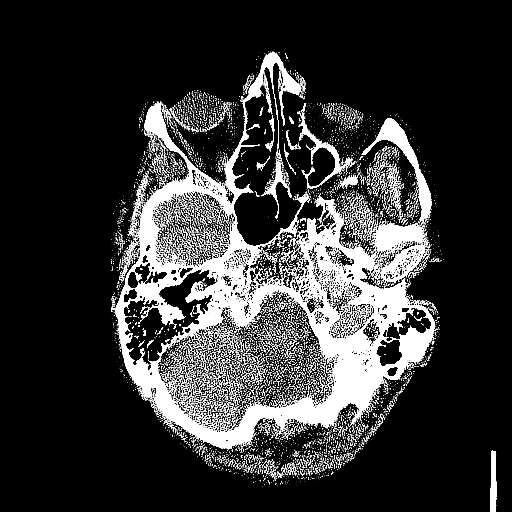
[im 24/80  brain]
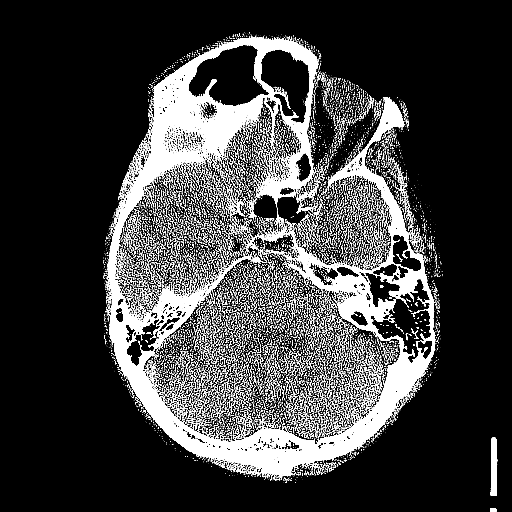
[im 32/80  brain]
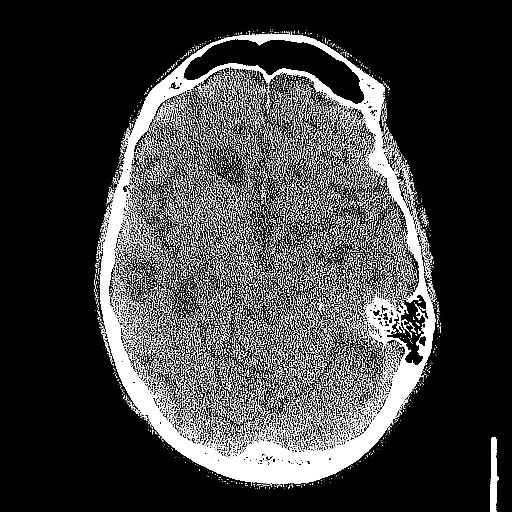
[im 40/80  brain]
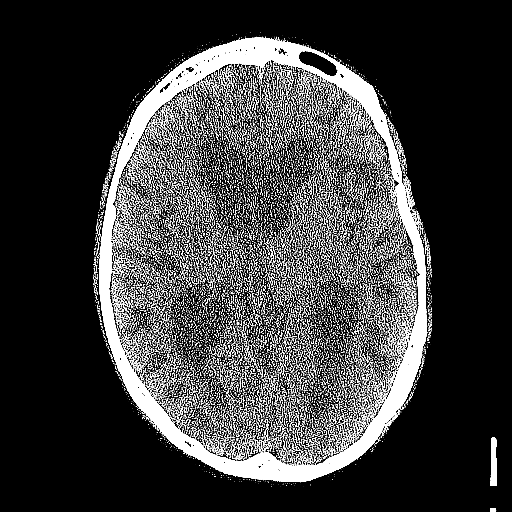
[im 40/80  bone]
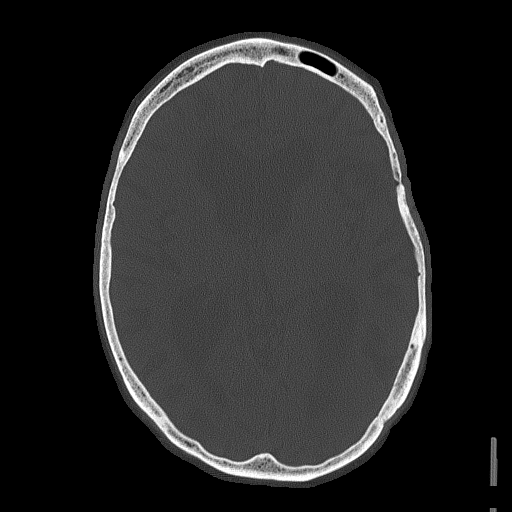
[im 48/80  brain]
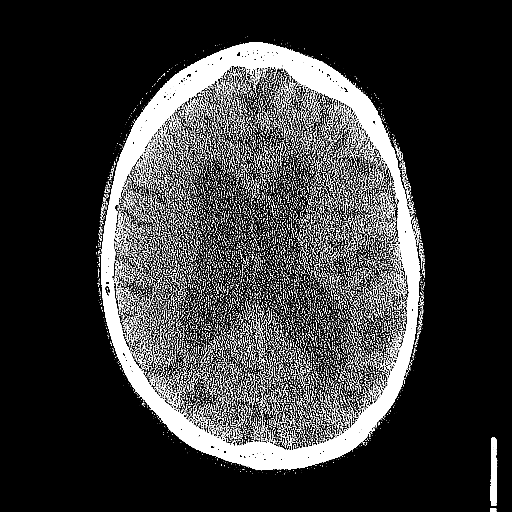
[im 56/80  brain]
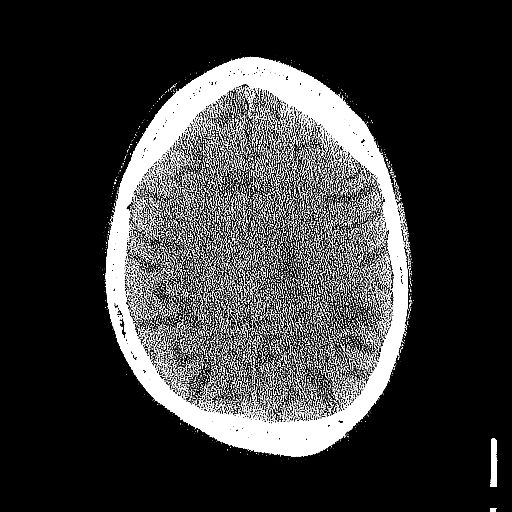
[im 64/80  brain]
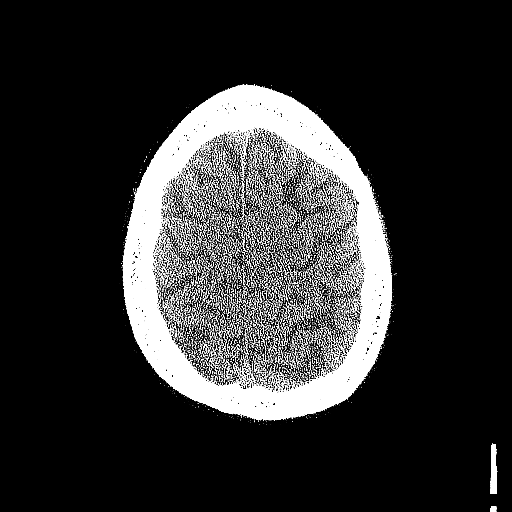
[im 72/80  brain]
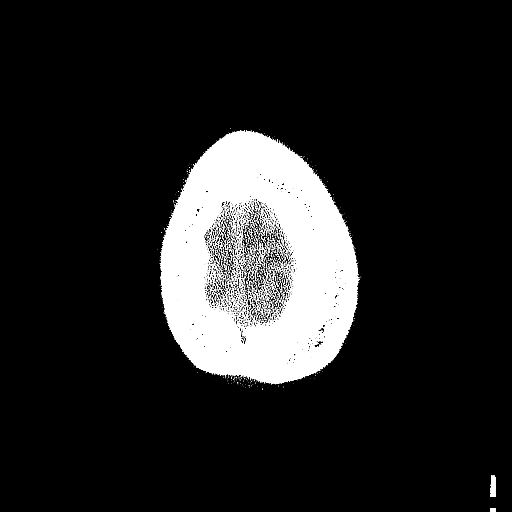
[im 72/80  bone]
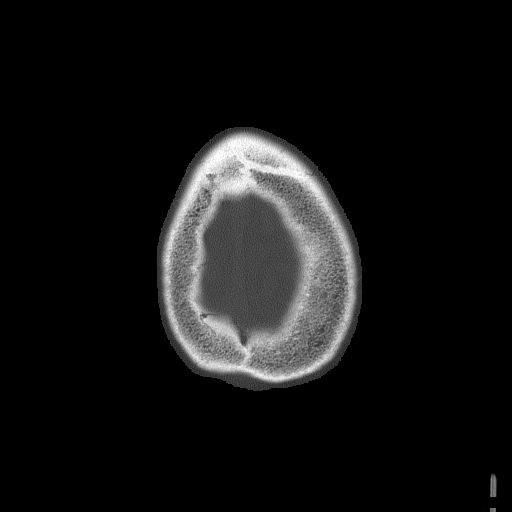

[Series 5: head 3.0 mpr cor · coronal · 0.32mm/px · 3 of 70 slices shown]
[im 24/70  brain]
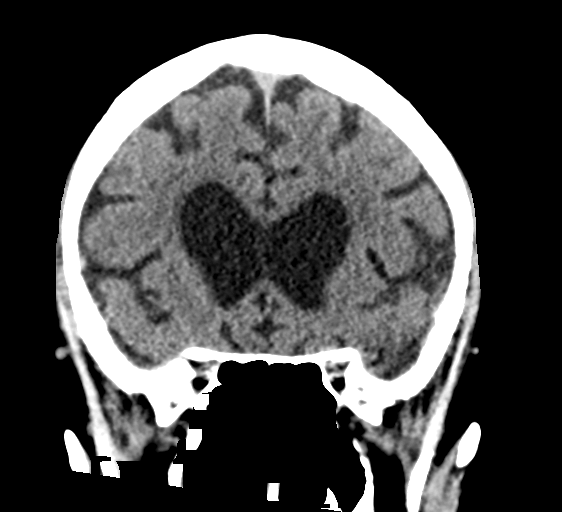
[im 31/70  brain]
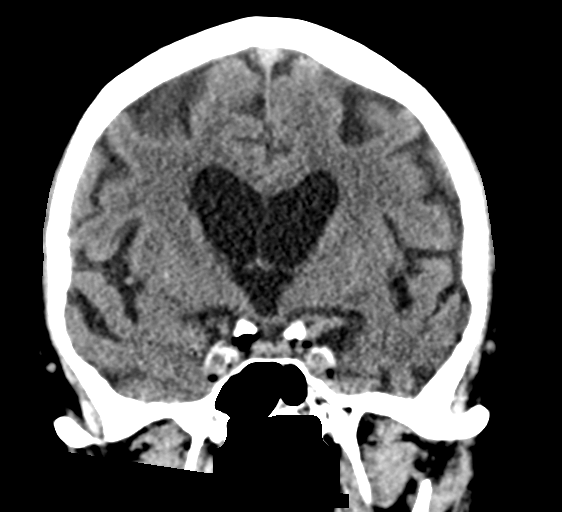
[im 39/70  brain]
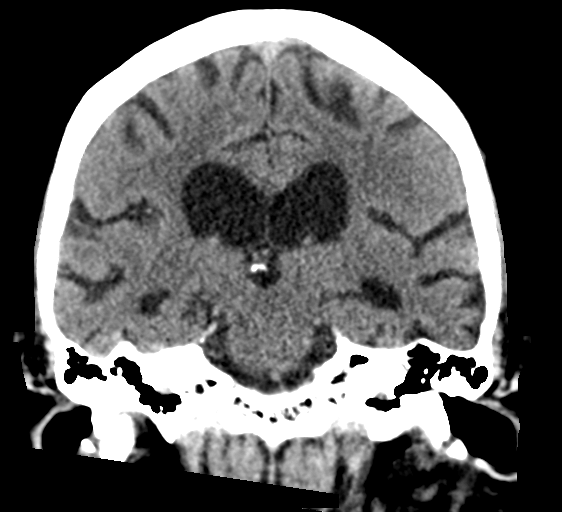

[Series 6: head 3.0 mpr sag · sagittal · 0.31mm/px · 3 of 58 slices shown]
[im 21/58  brain]
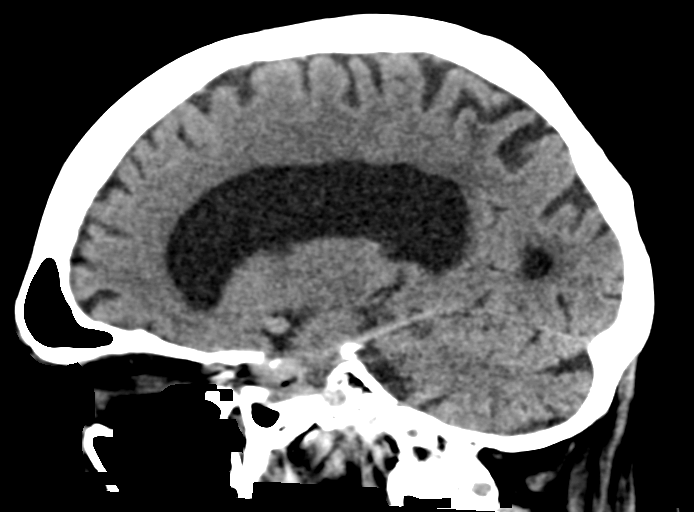
[im 29/58  brain]
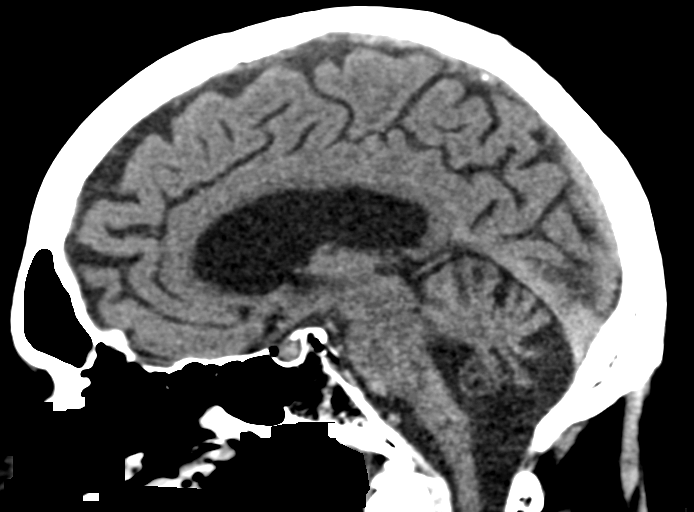
[im 37/58  brain]
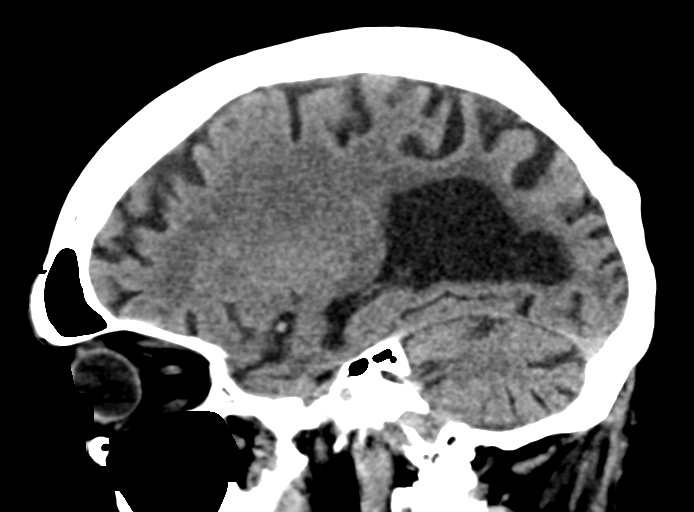

[15 of 47 positions shown; findings below may reference images not displayed]

FINDINGS: BRAIN: There is sulcal and ventricular prominence consistent with
superficial and central atrophy. No intraparenchymal hemorrhage,
mass effect nor midline shift. Periventricular and subcortical white
matter hypodensities consistent with chronic small vessel ischemic
disease are identified. Remote left posterior parietal cortical and
subcortical infarct with encephalomalacia. No acute large vascular
territory infarcts. No abnormal extra-axial fluid collections. Basal
cisterns are not effaced and midline.

VASCULAR: Mild to moderate calcific atherosclerosis of the carotid
siphons. No hyperdense vessels.

SKULL: No skull fracture. No significant scalp soft tissue swelling.

SINUSES/ORBITS: The mastoid air-cells are clear. Mild mucosal
thickening of the right maxillary sinus.The included ocular globes
and orbital contents are non-suspicious.

OTHER: Scattered intravascular dots of air likely iatrogenic within
veins of the right cheek and venous plexus of the pituitary fossa.
IMPRESSION: 1. No acute intracranial abnormality. Remote left posterior parietal
cortical and subcortical infarct.
2. Atrophy with chronic small vessel ischemia.
